# Patient Record
Sex: Male | Born: 1970 | Race: White | Hispanic: No | Marital: Married | Smoking: Former smoker
Health system: Southern US, Community
[De-identification: ages and names within clinical notes are randomized; demographics above are authoritative.]

## PROBLEM LIST (undated history)

## (undated) DIAGNOSIS — R7989 Other specified abnormal findings of blood chemistry: Secondary | ICD-10-CM

## (undated) DIAGNOSIS — F419 Anxiety disorder, unspecified: Secondary | ICD-10-CM

## (undated) DIAGNOSIS — E119 Type 2 diabetes mellitus without complications: Secondary | ICD-10-CM

## (undated) DIAGNOSIS — Z87891 Personal history of nicotine dependence: Secondary | ICD-10-CM

## (undated) DIAGNOSIS — G4730 Sleep apnea, unspecified: Secondary | ICD-10-CM

## (undated) DIAGNOSIS — K219 Gastro-esophageal reflux disease without esophagitis: Secondary | ICD-10-CM

## (undated) DIAGNOSIS — E7800 Pure hypercholesterolemia, unspecified: Secondary | ICD-10-CM

## (undated) DIAGNOSIS — E785 Hyperlipidemia, unspecified: Secondary | ICD-10-CM

## (undated) HISTORY — DX: Sleep apnea, unspecified: G47.30

## (undated) HISTORY — DX: Other specified abnormal findings of blood chemistry: R79.89

## (undated) HISTORY — PX: CARPAL TUNNEL RELEASE: SHX101

## (undated) HISTORY — PX: WISDOM TOOTH EXTRACTION: SHX21

## (undated) HISTORY — DX: Gastro-esophageal reflux disease without esophagitis: K21.9

## (undated) HISTORY — DX: Hyperlipidemia, unspecified: E78.5

## (undated) HISTORY — PX: TENDON REPAIR: SHX5111

## (undated) HISTORY — DX: Personal history of nicotine dependence: Z87.891

---

## 2012-05-31 ENCOUNTER — Ambulatory Visit: Payer: Self-pay

## 2014-12-05 ENCOUNTER — Encounter: Payer: Self-pay | Admitting: Emergency Medicine

## 2014-12-05 ENCOUNTER — Emergency Department
Admission: EM | Admit: 2014-12-05 | Discharge: 2014-12-05 | Disposition: A | Discharge status: Home / Self Care | Payer: 59 | Attending: Emergency Medicine | Admitting: Emergency Medicine

## 2014-12-05 ENCOUNTER — Emergency Department: Payer: 59

## 2014-12-05 DIAGNOSIS — M779 Enthesopathy, unspecified: Secondary | ICD-10-CM | POA: Diagnosis not present

## 2014-12-05 DIAGNOSIS — E119 Type 2 diabetes mellitus without complications: Secondary | ICD-10-CM | POA: Insufficient documentation

## 2014-12-05 DIAGNOSIS — M25512 Pain in left shoulder: Secondary | ICD-10-CM | POA: Diagnosis present

## 2014-12-05 DIAGNOSIS — M7522 Bicipital tendinitis, left shoulder: Secondary | ICD-10-CM

## 2014-12-05 HISTORY — DX: Type 2 diabetes mellitus without complications: E11.9

## 2014-12-05 HISTORY — DX: Pure hypercholesterolemia, unspecified: E78.00

## 2014-12-05 HISTORY — DX: Anxiety disorder, unspecified: F41.9

## 2014-12-05 MED ORDER — TRAMADOL HCL 50 MG PO TABS: 50.0000 mg | ORAL_TABLET | Freq: Two times a day (BID) | ORAL | Status: DC

## 2014-12-05 MED ORDER — KETOROLAC TROMETHAMINE 10 MG PO TABS
20.0000 mg | ORAL_TABLET | Freq: Once | ORAL | Status: AC
  Administered 2014-12-05: 20 mg via ORAL

## 2014-12-05 MED ORDER — KETOROLAC TROMETHAMINE 10 MG PO TABS
ORAL_TABLET | ORAL | Status: AC
  Filled 2014-12-05: qty 2

## 2014-12-05 MED ORDER — OMEPRAZOLE 40 MG PO CPDR
40.0000 mg | DELAYED_RELEASE_CAPSULE | Freq: Every day | ORAL | Status: DC
Start: 2014-12-05 — End: 2017-12-09

## 2014-12-05 MED ORDER — KETOROLAC TROMETHAMINE 10 MG PO TABS: 10.0000 mg | ORAL_TABLET | Freq: Three times a day (TID) | ORAL | Status: DC

## 2014-12-05 NOTE — Discharge Instructions (Signed)
Bicipital Tendonitis Bicipital tendonitis refers to redness, soreness, and swelling (inflammation) or irritation of the bicep tendon. The biceps muscle is located between the elbow and shoulder of the inner arm. The tendon heads, similar to pieces of rope, connect the bicep muscle to the shoulder socket. They are called short head and long head tendons. When tendonitis occurs, the long head tendon is inflamed and swollen, and may be thickened or partially torn.  Bicipital tendonitis can occur with other problems as well, such as arthritis in the shoulder or acromioclavicular joints, tears in the tendons, or other rotator cuff problems.  CAUSES  Overuse of of the arms for overhead activities is the major cause of tendonitis. Many athletes, such as swimmers, baseball players, and tennis players are prone to bicipital tendonitis. Jobs that require manual labor or routine chores, especially chores involving overhead activities can result in overuse and tendonitis. SYMPTOMS Symptoms may include:  Pain in and around the front of the shoulder. Pain may be worse with overhead motion.  Pain or aching that radiates down the arm.  Clicking or shifting sensations in the shoulder. DIAGNOSIS Your caregiver may perform the following:  Physical exam and tests of the biceps and shoulder to observe range of motion, strength, and stability.  X-rays or magnetic resonance imaging (MRI) to confirm the diagnosis. In most common cases, these tests are not necessary. Since other problems may exist in the shoulder or rotator cuff, additional tests may be recommended. TREATMENT Treatment may include the following:  Medications  Your caregiver may prescribe over-the-counter pain relievers.  Steroid injections, such as cortisone, may be recommended. These may help to reduce inflammation and pain.  Physical Therapy - Your caregiver may recommend gentle exercises with the arm. These can help restore strength and range  of motion. They may be done at home or with a physical therapist's supervision and input.  Surgery - Arthroscopic or open surgery sometimes is necessary. Surgery may include:  Reattachment or repair of the tendon at the shoulder socket.  Removal of the damaged section of the tendon.  Anchoring the tendon to a different area of the shoulder (tenodesis). HOME CARE INSTRUCTIONS   Avoid overhead motion of the affected arm or any other motion that causes pain.  Take medication for pain as directed. Do not take these for more than 3 weeks, unless directed to do so by your caregiver.  Ice the affected area for 20 minutes at a time, 3-4 times per day. Place a towel on the skin over the painful area and the ice or cold pack over the towel. Do not place ice directly on the skin.  Perform gentle exercises at home as directed. These will increase strength and flexibility. PREVENTION  Modify your activities as much as possible to protect your arm. A physical therapist or sports medicine physician can help you understand options for safe motion.  Avoid repetitive overhead pulling, lifting, reaching, and throwing until your caregiver tells you it is ok to resume these activities. SEEK MEDICAL CARE IF:  Your pain worsens.  You have difficulty moving the affected arm.  You have trouble performing any of the self-care instructions. MAKE SURE YOU:   Understand these instructions.  Will watch your condition.  Will get help right away if you are not doing well or get worse. Document Released: 07/22/2010 Document Revised: 09/11/2011 Document Reviewed: 07/22/2010 [facility] Patient Information 2015 [place], [place]. This information is not intended to replace advice given to you by your health care provider.  Make sure you discuss any questions you have with your health care provider.  Take the prescription meds as directed.  Apply ice to reduce symptoms.  Follow-up with Dean Farmer for ongoing symptoms.

## 2014-12-05 NOTE — ED Notes (Signed)
Reports left shoulder pain x 3 months, worse with movement.

## 2014-12-05 NOTE — ED Notes (Signed)
Pt unable to sign discharge instructions due to computer down.

## 2014-12-05 NOTE — ED Provider Notes (Signed)
[facility] Emergency Department Provider Note ____________________________________________  Time seen: 1325  I have reviewed the triage vital signs and the nursing notes.  HISTORY  Chief Complaint Shoulder Pain  HPI Dean Farmer is a 44 y.o. male left-hand dominant, with left shoulder pain intermittently over the last 1-2 months. He denies any known injury or trauma, but describes a constant dull pain with occasional sharp pain. He works making foam for bus seats, but denies any lifting injury or increased work load.He describes the shoulder pain is aggravated by ranging the shoulder and external rotation as well as some extension range. He reports his pain at 8 out of 10 at triage.  Past Medical History  Diagnosis Date  . Diabetes mellitus without complication   . Hypercholesteremia   . Anxiety    There are no active problems to display for this patient.  History reviewed. No pertinent past surgical history.  Current Outpatient Rx  Name  Route  Sig  Dispense  Refill  . ketorolac (TORADOL) 10 MG tablet   Oral   Take 1 tablet (10 mg total) by mouth every 8 (eight) hours.   15 tablet   0   . omeprazole (PRILOSEC) 40 MG capsule   Oral   Take 1 capsule (40 mg total) by mouth daily.   20 capsule   0   . traMADol (ULTRAM) 50 MG tablet   Oral   Take 1 tablet (50 mg total) by mouth 2 (two) times daily.   10 tablet   0    Allergies Review of patient's allergies indicates no known allergies.  History reviewed. No pertinent family history.  Social History History  Substance Use Topics  . Smoking status: Never Smoker   . Smokeless tobacco: Not on file  . Alcohol Use: No   Review of Systems  Constitutional: Negative for fever. Eyes: Negative for visual changes. ENT: Negative for sore throat. Cardiovascular: Negative for chest pain. Respiratory: Negative for shortness of breath. Gastrointestinal: Negative for abdominal pain, vomiting and  diarrhea. Genitourinary: Negative for dysuria. Musculoskeletal: Negative for back pain. Positive for shoulder pain as above. Skin: Negative for rash. Neurological: Negative for headaches, focal weakness or numbness. ____________________________________________  PHYSICAL EXAM:  VITAL SIGNS: ED Triage Vitals  Enc Vitals Group     BP 12/05/14 1237 133/69 mmHg     Pulse Rate 12/05/14 1237 81     Resp 12/05/14 1237 20     Temp 12/05/14 1237 98.6 F (37 C)     Temp Source 12/05/14 1237 Oral     SpO2 12/05/14 1237 97 %     Weight 12/05/14 1237 305 lb (138.347 kg)     Height 12/05/14 1237 6' (1.829 m)     Head Cir --      Peak Flow --      Pain Score 12/05/14 1238 8     Pain Loc --      Pain Edu? --      Excl. in [place]? --    Constitutional: Alert and oriented. Well appearing and in no distress. Eyes: Conjunctivae are normal. PERRL. Normal extraocular movements. ENT   Head: Normocephalic and atraumatic.   Nose: No congestion/rhinnorhea.   Mouth/Throat: Mucous membranes are moist.   Neck: No stridor. Hematological/Lymphatic/Immunilogical: No cervical lymphadenopathy. Cardiovascular: Normal rate, regular rhythm.  Respiratory: Normal respiratory effort.No wheezes/rales/rhonchi. Gastrointestinal: Soft and nontender. No distention. Musculoskeletal: Nontender with normal range of motion in LUE. Tenderness to palp over the left biceps tendon.  Normal strength testing. Mildly positive Yergason's on the left.   Neurologic:  Normal speech and language. No gross focal neurologic deficits are appreciated. Skin:  Skin is warm, dry and intact. No rash noted. Psychiatric: Mood and affect are normal. Patient exhibits appropriate insight and judgment. ____________________________________________   RADIOLOGY Left Shoulder IMPRESSION: No acute osseous abnormality. ____________________________________________  PROCEDURES  Toradol 20 mg  PO ____________________________________________  INITIAL IMPRESSION / ASSESSMENT AND PLAN / ED COURSE  Radiology results to patient and family Biceps tendinitis. Treatment with Toradol, Ultram, and Omeprazole for history of reflux. Referral to ortho for ongoing symptoms. ____________________________________________  FINAL CLINICAL IMPRESSION(S) / ED DIAGNOSES  Final diagnoses:  Biceps tendinitis on left     Melvenia Needles, PA-C 12/05/14 1544  Lavonia Drafts, MD 12/05/14 [id number]

## 2015-05-29 ENCOUNTER — Encounter: Payer: Self-pay | Admitting: Emergency Medicine

## 2015-05-29 ENCOUNTER — Emergency Department
Admission: EM | Admit: 2015-05-29 | Discharge: 2015-05-29 | Disposition: A | Discharge status: Home / Self Care | Payer: Managed Care, Other (non HMO) | Attending: Emergency Medicine | Admitting: Emergency Medicine

## 2015-05-29 DIAGNOSIS — Z791 Long term (current) use of non-steroidal anti-inflammatories (NSAID): Secondary | ICD-10-CM | POA: Diagnosis not present

## 2015-05-29 DIAGNOSIS — E785 Hyperlipidemia, unspecified: Secondary | ICD-10-CM | POA: Insufficient documentation

## 2015-05-29 DIAGNOSIS — E119 Type 2 diabetes mellitus without complications: Secondary | ICD-10-CM | POA: Insufficient documentation

## 2015-05-29 DIAGNOSIS — Z79899 Other long term (current) drug therapy: Secondary | ICD-10-CM | POA: Diagnosis not present

## 2015-05-29 DIAGNOSIS — M545 Low back pain, unspecified: Secondary | ICD-10-CM

## 2015-05-29 DIAGNOSIS — M549 Dorsalgia, unspecified: Secondary | ICD-10-CM | POA: Diagnosis present

## 2015-05-29 MED ORDER — KETOROLAC TROMETHAMINE 60 MG/2ML IM SOLN
60.0000 mg | Freq: Once | INTRAMUSCULAR | Status: AC
Start: 2015-05-29 — End: 2015-05-29
  Administered 2015-05-29: 60 mg via INTRAMUSCULAR
  Filled 2015-05-29: qty 2

## 2015-05-29 MED ORDER — HYDROMORPHONE HCL 1 MG/ML IJ SOLN
1.0000 mg | Freq: Once | INTRAMUSCULAR | Status: AC
  Administered 2015-05-29: 1 mg via INTRAMUSCULAR
  Filled 2015-05-29: qty 1

## 2015-05-29 MED ORDER — ORPHENADRINE CITRATE 30 MG/ML IJ SOLN
60.0000 mg | Freq: Two times a day (BID) | INTRAMUSCULAR | Status: DC
  Administered 2015-05-29: 60 mg via INTRAMUSCULAR
  Filled 2015-05-29: qty 2

## 2015-05-29 NOTE — ED Notes (Signed)
[facility] until 1645

## 2015-05-29 NOTE — ED Notes (Signed)
Denies injury.

## 2015-05-29 NOTE — Discharge Instructions (Signed)
Follow-up with scheduled orthopedic department in 3 days. Back Pain, Adult Back pain is very common. The pain often gets better over time. The cause of back pain is usually not dangerous. Most people can learn to manage their back pain on their own.  HOME CARE  Watch your back pain for any changes. The following actions may help to lessen any pain you are feeling:  Stay active. Start with short walks on flat ground if you can. Try to walk farther each day.  Exercise regularly as told by your doctor. Exercise helps your back heal faster. It also helps avoid future injury by keeping your muscles strong and flexible.  Do not sit, drive, or stand in one place for more than 30 minutes.  Do not stay in bed. Resting more than 1-2 days can slow down your recovery.  Be careful when you bend or lift an object. Use good form when lifting:  Bend at your knees.  Keep the object close to your body.  Do not twist.  Sleep on a firm mattress. Lie on your side, and bend your knees. If you lie on your back, put a pillow under your knees.  Take medicines only as told by your doctor.  Put ice on the injured area.  Put ice in a plastic bag.  Place a towel between your skin and the bag.  Leave the ice on for 20 minutes, 2-3 times a day for the first 2-3 days. After that, you can switch between ice and heat packs.  Avoid feeling anxious or stressed. Find good ways to deal with stress, such as exercise.  Maintain a healthy weight. Extra weight puts stress on your back. GET HELP IF:   You have pain that does not go away with rest or medicine.  You have worsening pain that goes down into your legs or buttocks.  You have pain that does not get better in one week.  You have pain at night.  You lose weight.  You have a fever or chills. GET HELP RIGHT AWAY IF:   You cannot control when you poop (bowel movement) or pee (urinate).  Your arms or legs feel weak.  Your arms or legs lose feeling  (numbness).  You feel sick to your stomach (nauseous) or throw up (vomit).  You have belly (abdominal) pain.  You feel like you may pass out (faint).   This information is not intended to replace advice given to you by your health care provider. Make sure you discuss any questions you have with your health care provider.   Document Released: 12/06/2007 Document Revised: 07/10/2014 Document Reviewed: 10/21/2013 Elsevier Interactive Patient Education [employer].

## 2015-05-29 NOTE — ED Provider Notes (Signed)
[facility] Emergency Department Provider Note  ____________________________________________  Time seen: Approximately 3:47 PM  I have reviewed the triage vital signs and the nursing notes.   HISTORY  Chief Complaint Back Pain    HPI Dean Farmer is a 44 y.o. male patient here for back pain score in all 4 month and half. Patient has been followed by the medicine clinic had x-ray was told it was arthritis. Patient pain is unrelieved with multiple. Tramadol and Zanaflex. Patient scheduled to see orthopedics at medicine in 3 days. Patient denies any radicular component to this pain he denies any bladder or bowel dysfunction. Patient is rating his pain as a 10 over 10 described as sharp.   Past Medical History  Diagnosis Date  . Diabetes mellitus without complication ([place])   . Hypercholesteremia   . Anxiety     There are no active problems to display for this patient.   Past Surgical History  Procedure Laterality Date  . Tendon repair    . Carpal tunnel release      Current Outpatient Rx  Name  Route  Sig  Dispense  Refill  . ketorolac (TORADOL) 10 MG tablet   Oral   Take 1 tablet (10 mg total) by mouth every 8 (eight) hours.   15 tablet   0   . omeprazole (PRILOSEC) 40 MG capsule   Oral   Take 1 capsule (40 mg total) by mouth daily.   20 capsule   0   . traMADol (ULTRAM) 50 MG tablet   Oral   Take 1 tablet (50 mg total) by mouth 2 (two) times daily.   10 tablet   0     Allergies Review of patient's allergies indicates no known allergies.  No family history on file.  Social History Social History  Substance Use Topics  . Smoking status: Never Smoker   . Smokeless tobacco: None  . Alcohol Use: No    Review of Systems Constitutional: No fever/chills Eyes: No visual changes. ENT: No sore throat. Cardiovascular: Denies chest pain. Respiratory: Denies shortness of breath. Gastrointestinal: No abdominal pain.  No nausea, no  vomiting.  No diarrhea.  No constipation. Genitourinary: Negative for dysuria. Musculoskeletal: Positive for back pain. Skin: Negative for rash. Neurological: Negative for headaches, focal weakness or numbness. Endocrine:Diabetes and hyperlipidemia 10-point ROS otherwise negative.  ____________________________________________   PHYSICAL EXAM:  VITAL SIGNS: ED Triage Vitals  Enc Vitals Group     BP 05/29/15 1443 166/86 mmHg     Pulse Rate 05/29/15 1443 96     Resp 05/29/15 1443 20     Temp 05/29/15 1443 98.4 F (36.9 C)     Temp Source 05/29/15 1443 Oral     SpO2 05/29/15 1443 100 %     Weight 05/29/15 1443 295 lb (133.811 kg)     Height 05/29/15 1443 6' (1.829 m)     Head Cir --      Peak Flow --      Pain Score 05/29/15 1443 10     Pain Loc --      Pain Edu? --      Excl. in [place]? --     Constitutional: Alert and oriented. Well appearing and in no acute distress. Eyes: Conjunctivae are normal. PERRL. EOMI. Head: Atraumatic. Nose: No congestion/rhinnorhea. Mouth/Throat: Mucous membranes are moist.  Oropharynx non-erythematous. Neck: No stridor.  No cervical spine tenderness to palpation. Hematological/Lymphatic/Immunilogical: No cervical lymphadenopathy. Cardiovascular: Normal rate, regular rhythm. Grossly normal  heart sounds.  Good peripheral circulation. Respiratory: Normal respiratory effort.  No retractions. Lungs CTAB. Gastrointestinal: Soft and nontender. No distention. No abdominal bruits. No CVA tenderness. Musculoskeletal: No spinal deformity. Patient has some moderate guarding palpation L3-L5. Decreased range of motion's all fields. Patient has negative straight leg test. Neurologic:  Normal speech and language. No gross focal neurologic deficits are appreciated. No gait instability. Skin:  Skin is warm, dry and intact. No rash noted. Psychiatric: Mood and affect are normal. Speech and behavior are normal.  ____________________________________________    LABS (all labs ordered are listed, but only abnormal results are displayed)  Labs Reviewed - No data to display ____________________________________________  EKG   ____________________________________________  RADIOLOGY   ____________________________________________   PROCEDURES  Procedure(s) performed: None  Critical Care performed: No  ____________________________________________   INITIAL IMPRESSION / ASSESSMENT AND PLAN / ED COURSE  Pertinent labs & imaging results that were available during my care of the patient were reviewed by me and considered in my medical decision making (see chart for details).  Acute back pain. Advised patient to follow-up with scheduled orthopedic appointment in 3 days. Patient  given given Dilaudid, Norflex, and Toradol IM. Patient advised to continue previous medications. ____________________________________________   FINAL CLINICAL IMPRESSION(S) / ED DIAGNOSES  Final diagnoses:  Back pain at L4-L5 level      Sable Feil, PA-C 05/29/15 1558  Harvest Dark, MD 05/29/15 2252

## 2015-05-29 NOTE — ED Notes (Signed)
Has RX for mobic, tramadol and tizanidine. No relief from any medications. States he is very uncomfortable. Cannot hold position for long without having to reposition. Is seeing an ortho at [facility] on Tuesday.

## 2015-08-09 ENCOUNTER — Other Ambulatory Visit: Payer: Self-pay | Admitting: Orthopedic Surgery

## 2015-08-09 DIAGNOSIS — M5441 Lumbago with sciatica, right side: Secondary | ICD-10-CM

## 2015-09-07 ENCOUNTER — Ambulatory Visit: Payer: Managed Care, Other (non HMO)

## 2016-07-31 ENCOUNTER — Encounter: Payer: Self-pay | Admitting: Emergency Medicine

## 2016-07-31 ENCOUNTER — Emergency Department
Admission: EM | Admit: 2016-07-31 | Discharge: 2016-07-31 | Disposition: A | Discharge status: Home / Self Care | Payer: Commercial Managed Care - PPO | Attending: Emergency Medicine | Admitting: Emergency Medicine

## 2016-07-31 DIAGNOSIS — R05 Cough: Secondary | ICD-10-CM | POA: Diagnosis present

## 2016-07-31 DIAGNOSIS — E119 Type 2 diabetes mellitus without complications: Secondary | ICD-10-CM | POA: Diagnosis not present

## 2016-07-31 DIAGNOSIS — J111 Influenza due to unidentified influenza virus with other respiratory manifestations: Secondary | ICD-10-CM | POA: Diagnosis not present

## 2016-07-31 MED ORDER — OSELTAMIVIR PHOSPHATE 75 MG PO CAPS
75.0000 mg | ORAL_CAPSULE | Freq: Two times a day (BID) | ORAL | 0 refills | Status: AC
Start: 2016-07-31 — End: 2016-08-05

## 2016-07-31 MED ORDER — HYDROCOD POLST-CPM POLST ER 10-8 MG/5ML PO SUER: 5.0000 mL | Freq: Two times a day (BID) | ORAL | 0 refills | Status: AC

## 2016-07-31 NOTE — ED Notes (Signed)
See triage note developed cough and body aches with some fever for the past 2 days   Low grade temp on arrival

## 2016-07-31 NOTE — ED Provider Notes (Signed)
[facility] Emergency Department Provider Note  ____________________________________________  Time seen: Approximately 6:46 PM  I have reviewed the triage vital signs and the nursing notes.   HISTORY  Chief Complaint Cough    HPI Dean Farmer is a 46 y.o. male presenting to the emergency department with headache, congestion, rhinorrhea, nonproductive cough, malaise, diffuse body aches and diarrhea. Patient has been afebrile. However, he has had chills and sweats. Patient has numerous sick contacts at work. Patient denies chest pain, chest tightness, shortness of breath, current abdominal pain and current nausea. Patient has tried TheraFlu but no other alleviating measures. No recent travel.   Past Medical History:  Diagnosis Date  . Anxiety   . Diabetes mellitus without complication ([place])   . Hypercholesteremia     There are no active problems to display for this patient.   Past Surgical History:  Procedure Laterality Date  . CARPAL TUNNEL RELEASE    . TENDON REPAIR      Prior to Admission medications   Medication Sig Start Date End Date Taking? Authorizing Provider  chlorpheniramine-HYDROcodone (TUSSIONEX PENNKINETIC ER) 10-8 MG/5ML SUER Take 5 mLs by mouth 2 (two) times daily. 07/31/16 08/05/16  Lannie Fields, PA-C  ketorolac (TORADOL) 10 MG tablet Take 1 tablet (10 mg total) by mouth every 8 (eight) hours. 12/05/14   Jenise V Bacon Menshew, PA-C  omeprazole (PRILOSEC) 40 MG capsule Take 1 capsule (40 mg total) by mouth daily. 12/05/14   Jenise V Bacon Menshew, PA-C  oseltamivir (TAMIFLU) 75 MG capsule Take 1 capsule (75 mg total) by mouth 2 (two) times daily. 07/31/16 08/05/16  Lannie Fields, PA-C  traMADol (ULTRAM) 50 MG tablet Take 1 tablet (50 mg total) by mouth 2 (two) times daily. 12/05/14   Dannielle Karvonen Menshew, PA-C    Allergies Patient has no known allergies.  No family history on file.  Social History Social History  Substance Use Topics   . Smoking status: Never Smoker  . Smokeless tobacco: Not on file  . Alcohol use No     Review of Systems  Constitutional: Patient has had chills/sweats Eyes: No visual changes. No discharge ENT: Patient has had congestion.  Cardiovascular: no chest pain. Respiratory: Patient has had non-productive cough. No SOB. Gastrointestinal: Patient has had nausea and diarrhea. Genitourinary: Negative for dysuria. No hematuria Musculoskeletal: Patient has had myalgias. Skin: Negative for rash, abrasions, lacerations, ecchymosis. Neurological: Negative for headaches, focal weakness or numbness.  ____________________________________________   PHYSICAL EXAM:  VITAL SIGNS: ED Triage Vitals  Enc Vitals Group     BP 07/31/16 1820 137/79     Pulse Rate 07/31/16 1820 93     Resp 07/31/16 1820 20     Temp 07/31/16 1820 99.4 F (37.4 C)     Temp Source 07/31/16 1820 Oral     SpO2 07/31/16 1820 98 %     Weight 07/31/16 1821 270 lb (122.5 kg)     Height 07/31/16 1821 6' (1.829 m)     Head Circumference --      Peak Flow --      Pain Score 07/31/16 1821 9     Pain Loc --      Pain Edu? --      Excl. in [place]? --      Constitutional: Alert and oriented. Patient is lying supine in bed.  Eyes: Conjunctivae are normal. PERRL. EOMI. Head: Atraumatic. ENT:      Ears: Tympanic membranes are injected bilaterally without  evidence of effusion or purulent exudate. Bony landmarks are visualized bilaterally. No pain with palpation at the tragus.      Nose: Nasal turbinates are edematous and erythematous. Copious rhinorrhea visualized.      Mouth/Throat: Mucous membranes are moist. Posterior pharynx is mildly erythematous. No tonsillar hypertrophy or purulent exudate. Uvula is midline. Neck: Full range of motion. No pain is elicited with flexion at the neck. Hematological/Lymphatic/Immunilogical: No cervical lymphadenopathy. Cardiovascular: Normal rate, regular rhythm. Normal S1 and S2.  Good peripheral  circulation. Respiratory: Normal respiratory effort without tachypnea or retractions. Patient coughs throughout lung exam.  Lungs CTAB. Good air entry to the bases with no decreased or absent breath sounds. Gastrointestinal: Bowel sounds 4 quadrants. Soft and nontender to palpation. No guarding or rigidity. No palpable masses. No distention. No CVA tenderness.  Skin:  Skin is warm, dry and intact. No rash noted. Psychiatric: Mood and affect are normal. Speech and behavior are normal. Patient exhibits appropriate insight and judgement. ____________________________________________   LABS (all labs ordered are listed, but only abnormal results are displayed)  Labs Reviewed - No data to display ____________________________________________  EKG   ____________________________________________  RADIOLOGY  No results found.  ____________________________________________    PROCEDURES  Procedure(s) performed:    Procedures    Medications - No data to display   ____________________________________________   INITIAL IMPRESSION / ASSESSMENT AND PLAN / ED COURSE  Pertinent labs & imaging results that were available during my care of the patient were reviewed by me and considered in my medical decision making (see chart for details).  Review of the [place] CSRS was performed in accordance of the [place] prior to dispensing any controlled drugs.     Assessment and Plan:  Influenza:  Patient presents to the emergency department with headache, congestion, rhinorrhea, nonproductive cough, malaise, diffuse body aches and diarrhea. Symptoms are consistent with influenza. Patient was discharged with Tamiflu and Tussionex for cough. Vital signs and physical exam are reassuring at this time. Patient was advised to follow-up with his primary care provider in one week. All patient questions were answered.  ____________________________________________  FINAL CLINICAL IMPRESSION(S) / ED  DIAGNOSES  Final diagnoses:  Influenza      NEW MEDICATIONS STARTED DURING THIS VISIT:  New Prescriptions   CHLORPHENIRAMINE-HYDROCODONE (TUSSIONEX PENNKINETIC ER) 10-8 MG/5ML SUER    Take 5 mLs by mouth 2 (two) times daily.   OSELTAMIVIR (TAMIFLU) 75 MG CAPSULE    Take 1 capsule (75 mg total) by mouth 2 (two) times daily.        This chart was dictated using voice recognition software/Dragon. Despite best efforts to proofread, errors can occur which can change the meaning. Any change was purely unintentional.    Lannie Fields, PA-C 07/31/16 1859    Harvest Dark, MD 08/03/16 1459

## 2016-07-31 NOTE — ED Triage Notes (Signed)
Cough fever and body aches x 2 days.

## 2017-09-14 ENCOUNTER — Emergency Department: Payer: Commercial Managed Care - PPO

## 2017-09-14 ENCOUNTER — Emergency Department
Admission: EM | Admit: 2017-09-14 | Discharge: 2017-09-14 | Disposition: A | Discharge status: Home / Self Care | Payer: Commercial Managed Care - PPO | Attending: Emergency Medicine | Admitting: Emergency Medicine

## 2017-09-14 ENCOUNTER — Encounter: Payer: Self-pay | Admitting: Emergency Medicine

## 2017-09-14 ENCOUNTER — Other Ambulatory Visit: Payer: Self-pay

## 2017-09-14 DIAGNOSIS — E119 Type 2 diabetes mellitus without complications: Secondary | ICD-10-CM | POA: Diagnosis not present

## 2017-09-14 DIAGNOSIS — Y9389 Activity, other specified: Secondary | ICD-10-CM | POA: Insufficient documentation

## 2017-09-14 DIAGNOSIS — Y998 Other external cause status: Secondary | ICD-10-CM | POA: Diagnosis not present

## 2017-09-14 DIAGNOSIS — Z79899 Other long term (current) drug therapy: Secondary | ICD-10-CM | POA: Diagnosis not present

## 2017-09-14 DIAGNOSIS — W230XXA Caught, crushed, jammed, or pinched between moving objects, initial encounter: Secondary | ICD-10-CM | POA: Insufficient documentation

## 2017-09-14 DIAGNOSIS — Y929 Unspecified place or not applicable: Secondary | ICD-10-CM | POA: Diagnosis not present

## 2017-09-14 DIAGNOSIS — Z7984 Long term (current) use of oral hypoglycemic drugs: Secondary | ICD-10-CM | POA: Diagnosis not present

## 2017-09-14 DIAGNOSIS — S8010XA Contusion of unspecified lower leg, initial encounter: Secondary | ICD-10-CM | POA: Diagnosis not present

## 2017-09-14 DIAGNOSIS — M545 Low back pain: Secondary | ICD-10-CM | POA: Insufficient documentation

## 2017-09-14 DIAGNOSIS — S8780XA Crushing injury of unspecified lower leg, initial encounter: Secondary | ICD-10-CM | POA: Diagnosis present

## 2017-09-14 MED ORDER — OXYCODONE-ACETAMINOPHEN 5-325 MG PO TABS: 1.0000 | ORAL_TABLET | Freq: Once | ORAL | Status: DC

## 2017-09-14 MED ORDER — OXYCODONE-ACETAMINOPHEN 5-325 MG PO TABS
1.0000 | ORAL_TABLET | Freq: Once | ORAL | Status: AC
  Administered 2017-09-14: 1 via ORAL
  Filled 2017-09-14: qty 1
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
## 2017-12-09 ENCOUNTER — Emergency Department (HOSPITAL_COMMUNITY): Payer: Commercial Managed Care - PPO

## 2017-12-09 ENCOUNTER — Emergency Department (HOSPITAL_COMMUNITY)
Admission: EM | Admit: 2017-12-09 | Discharge: 2017-12-09 | Disposition: A | Discharge status: Home / Self Care | Payer: Commercial Managed Care - PPO | Attending: Emergency Medicine | Admitting: Emergency Medicine

## 2017-12-09 DIAGNOSIS — R4182 Altered mental status, unspecified: Secondary | ICD-10-CM | POA: Diagnosis present

## 2017-12-09 DIAGNOSIS — T40601A Poisoning by unspecified narcotics, accidental (unintentional), initial encounter: Secondary | ICD-10-CM | POA: Insufficient documentation

## 2017-12-09 DIAGNOSIS — F10920 Alcohol use, unspecified with intoxication, uncomplicated: Secondary | ICD-10-CM | POA: Insufficient documentation

## 2017-12-09 DIAGNOSIS — Y902 Blood alcohol level of 40-59 mg/100 ml: Secondary | ICD-10-CM | POA: Diagnosis not present

## 2017-12-09 LAB — COMPREHENSIVE METABOLIC PANEL
ALT: 21 U/L (ref 17–63)
AST: 35 U/L (ref 15–41)
Albumin: 4.6 g/dL (ref 3.5–5.0)
Alkaline Phosphatase: 96 U/L (ref 38–126)
Anion gap: 14 (ref 5–15)
BUN: 12 mg/dL (ref 6–20)
CO2: 23 mmol/L (ref 22–32)
Calcium: 9.1 mg/dL (ref 8.9–10.3)
Chloride: 99 mmol/L — ABNORMAL LOW (ref 101–111)
Creatinine, Ser: 0.86 mg/dL (ref 0.61–1.24)
GFR calc Af Amer: >60 mL/min (ref >60)
GFR calc non Af Amer: >60 mL/min (ref >60)
Glucose, Bld: 369 mg/dL — ABNORMAL HIGH (ref 65–99)
Potassium: 3.7 mmol/L (ref 3.5–5.1)
Sodium: 136 mmol/L (ref 135–145)
Total Bilirubin: 0.6 mg/dL (ref 0.3–1.2)
Total Protein: 8 g/dL (ref 6.5–8.1)

## 2017-12-09 LAB — RAPID URINE DRUG SCREEN, HOSP PERFORMED
Amphetamines: NOT DETECTED
Barbiturates: NOT DETECTED
Benzodiazepines: NOT DETECTED
Cocaine: NOT DETECTED
Opiates: POSITIVE — AB
Tetrahydrocannabinol: POSITIVE — AB

## 2017-12-09 LAB — CBC WITH DIFFERENTIAL/PLATELET
Basophils Absolute: 0 10*3/uL (ref 0.0–0.1)
Basophils Relative: 0 %
Eosinophils Absolute: 0 10*3/uL (ref 0.0–0.7)
Eosinophils Relative: 0 %
HCT: 45.8 % (ref 39.0–52.0)
Hemoglobin: 16.4 g/dL (ref 13.0–17.0)
Lymphocytes Relative: 10 %
Lymphs Abs: 1 10*3/uL (ref 0.7–4.0)
MCH: 30.9 pg (ref 26.0–34.0)
MCHC: 35.8 g/dL (ref 30.0–36.0)
MCV: 86.3 fL (ref 78.0–100.0)
Monocytes Absolute: 0.6 10*3/uL (ref 0.1–1.0)
Monocytes Relative: 6 %
Neutro Abs: 7.9 10*3/uL — ABNORMAL HIGH (ref 1.7–7.7)
Neutrophils Relative %: 84 %
Platelets: 162 10*3/uL (ref 150–400)
RBC: 5.31 MIL/uL (ref 4.22–5.81)
RDW: 14.2 % (ref 11.5–15.5)
WBC: 9.5 10*3/uL (ref 4.0–10.5)

## 2017-12-09 LAB — URINALYSIS, ROUTINE W REFLEX MICROSCOPIC
Bilirubin Urine: NEGATIVE
Glucose, UA: >=500 mg/dL — AB
Hgb urine dipstick: NEGATIVE
Ketones, ur: NEGATIVE mg/dL
Leukocytes, UA: NEGATIVE
Nitrite: NEGATIVE
Protein, ur: NEGATIVE mg/dL
Specific Gravity, Urine: 1.002 — ABNORMAL LOW (ref 1.005–1.030)
pH: 6 (ref 5.0–8.0)

## 2017-12-09 LAB — I-STAT TROPONIN, ED: Troponin i, poc: 0 ng/mL (ref 0.00–0.08)

## 2017-12-09 LAB — ETHANOL: Alcohol, Ethyl (B): 56 mg/dL — ABNORMAL HIGH (ref <10)

## 2017-12-09 LAB — LIPASE, BLOOD: Lipase: 24 U/L (ref 11–51)

## 2017-12-09 LAB — MAGNESIUM: Magnesium: 1.9 mg/dL (ref 1.7–2.4)

## 2017-12-09 MED ORDER — SODIUM CHLORIDE 0.9 % IV BOLUS
1000.0000 mL | Freq: Once | INTRAVENOUS | Status: AC
  Administered 2017-12-09: 1000 mL via INTRAVENOUS
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
## 2017-12-09 NOTE — ED Notes (Signed)
Bed: AX09 Expected date:  Expected time:  Means of arrival:  Comments: 47 yo AMS- narcan given

## 2017-12-09 NOTE — Discharge Instructions (Signed)
Your work-up today was reassuring.  We suspect you had an axonal narcotic overdose of some kind.  He responded well to the Narcan and have been well since you have been here.  Your lab work was reassuring.  Please follow-up with a primary care physician for further management as well as follow-up with a mental health team for the problem we discussed.  If any symptoms change or worsen, please return to the nearest emergency department.

## 2017-12-09 NOTE — ED Notes (Signed)
EKG given to EDP,Tegeler,MD., for review. 

## 2017-12-09 NOTE — ED Triage Notes (Signed)
Pt comes to ed via ems, etoh overdose became unresponsive, pt was narcan ed  2mg  internal. Hx of diabetes.  bp 134/92, spo2 94 percent, pulse 92, [id number].

## 2020-02-08 IMAGING — CR DG CHEST 2V
2 series · 2 of 2 positions shown · non-contrast
Comparison: May 17, 2009

CLINICAL DATA: Patient is unresponsive, alcohol intoxication

EXAM:
CHEST - 2 VIEW

[w chest pa]
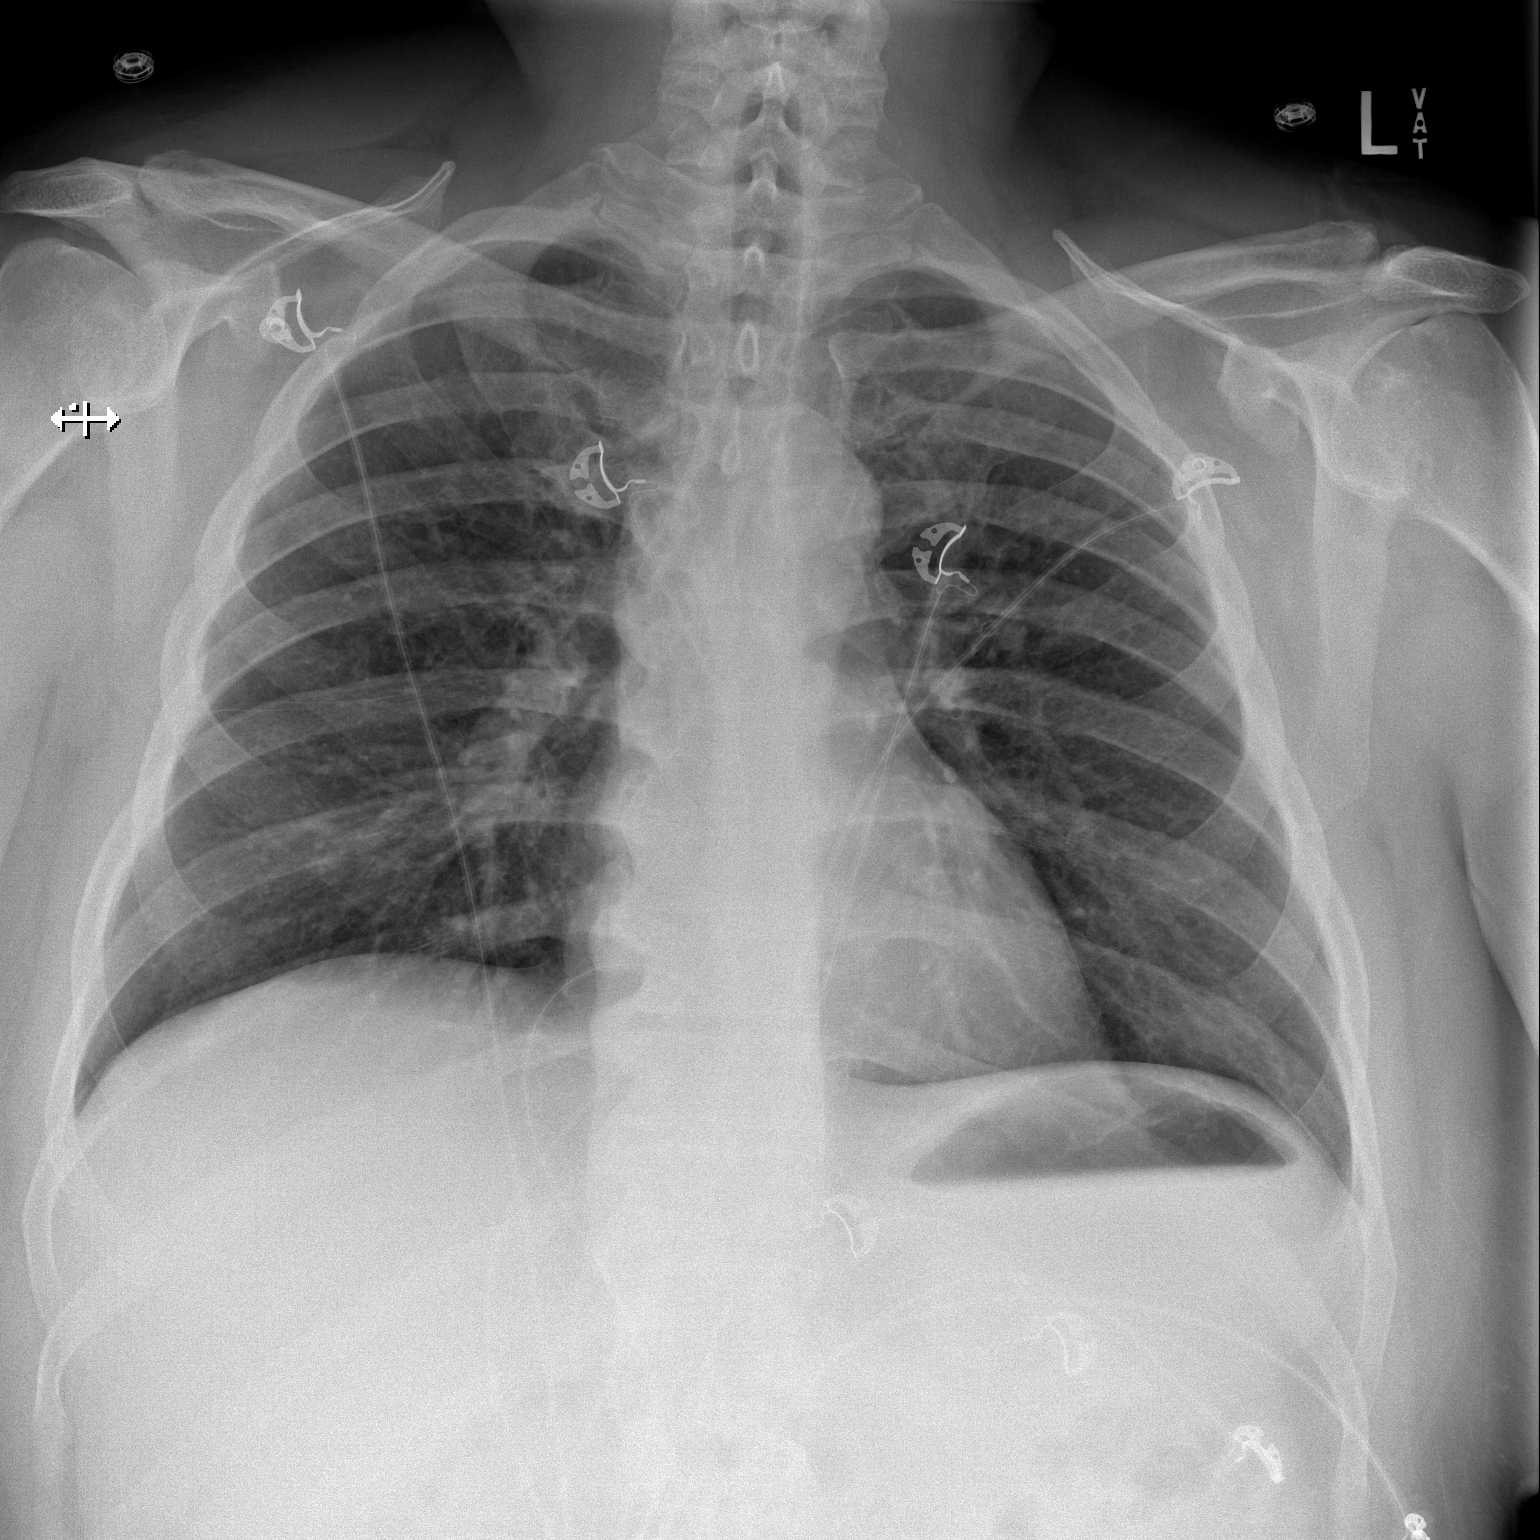

[w chest lat]
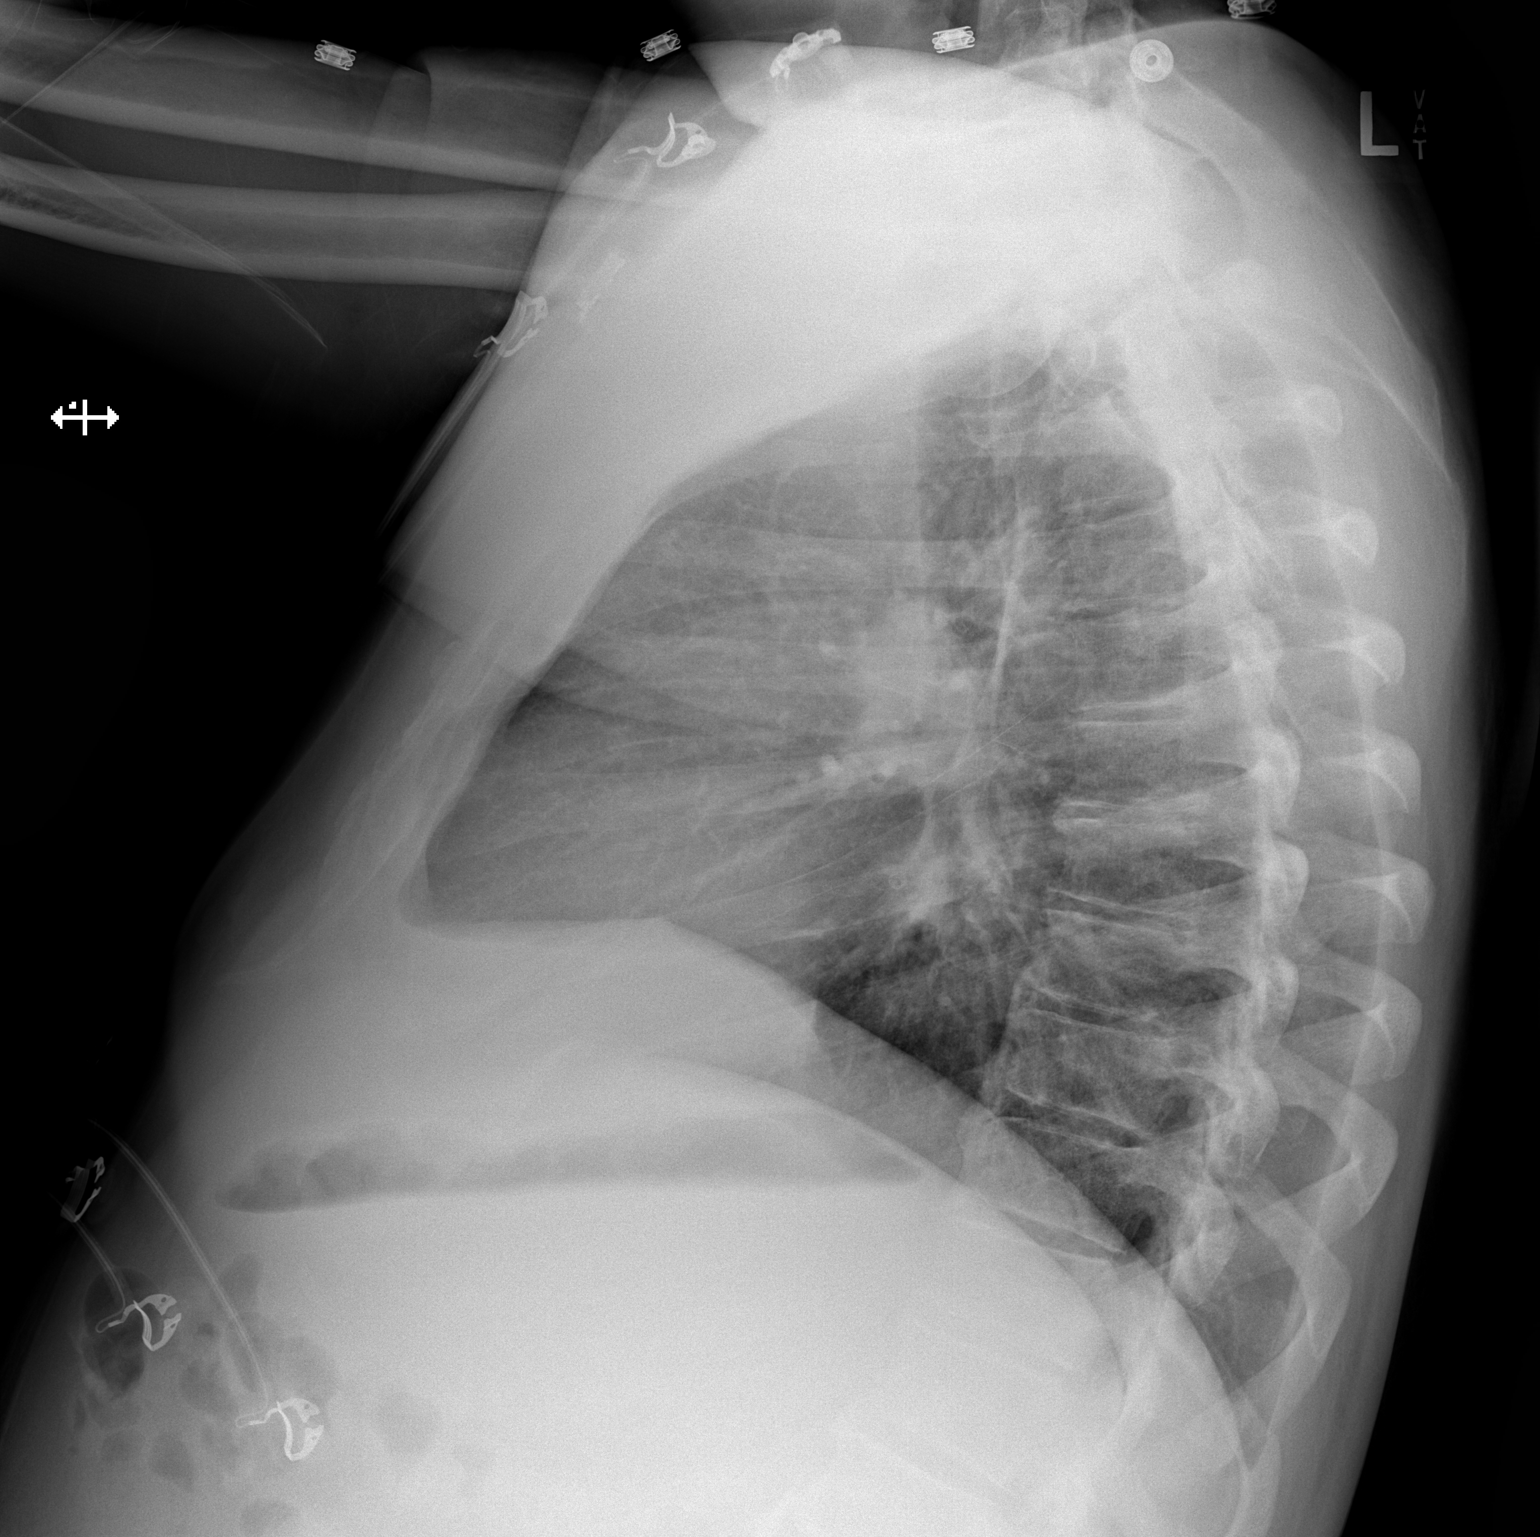

[2 of 2 positions shown; findings below may reference images not displayed]

FINDINGS: The heart size and mediastinal contours are within normal limits.
Both lungs are clear. The visualized skeletal structures are
unremarkable.
IMPRESSION: No active cardiopulmonary disease.

## 2020-09-22 ENCOUNTER — Encounter: Payer: Self-pay | Admitting: Gastroenterology

## 2020-09-22 ENCOUNTER — Ambulatory Visit (INDEPENDENT_AMBULATORY_CARE_PROVIDER_SITE_OTHER): Payer: Commercial Managed Care - PPO | Admitting: Gastroenterology

## 2020-09-22 ENCOUNTER — Other Ambulatory Visit: Payer: Self-pay

## 2020-09-22 DIAGNOSIS — R1310 Dysphagia, unspecified: Secondary | ICD-10-CM

## 2020-09-22 DIAGNOSIS — R112 Nausea with vomiting, unspecified: Secondary | ICD-10-CM | POA: Diagnosis not present

## 2020-09-22 DIAGNOSIS — Z1211 Encounter for screening for malignant neoplasm of colon: Secondary | ICD-10-CM | POA: Diagnosis not present

## 2020-09-22 MED ORDER — SUPREP BOWEL PREP KIT 17.5-3.13-1.6 GM/177ML PO SOLN: 1.0000 | ORAL | 0 refills | Status: DC

## 2020-09-22 MED ORDER — OMEPRAZOLE 40 MG PO CPDR: 40.0000 mg | DELAYED_RELEASE_CAPSULE | Freq: Every day | ORAL | 6 refills | Status: DC

## 2020-09-22 NOTE — Progress Notes (Signed)
HPI: This is a very pleasant 50 year old man who was referred to me by London Pepper, MD  to evaluate intermittent solid and liquid food dysphagia.    For a year or so he was bothered by nausea and vomiting on nearly a daily basis.  When he stopped Ozempic 3 weeks ago this is significantly improved.  He has not had vomiting since then.  He does still have mild nausea.  He has pyrosis, heartburn only rarely.  He has solid and liquid food dysphagia on an intermittent basis several times a week.  This can cause spasms in his chest.  He has never had to regurgitate material out.  One of his uncles had either gastric or esophageal cancer.  Another uncle had colon cancer.  His weight is overall stable.   Review of systems: Pertinent positive and negative review of systems were noted in the above HPI section. All other review negative.   Past Medical History:  Diagnosis Date  . Anxiety   . Diabetes mellitus without complication ([place])   . Hypercholesteremia   . Hyperlipidemia   . Low vitamin D level     Past Surgical History:  Procedure Laterality Date  . CARPAL TUNNEL RELEASE    . TENDON REPAIR      Current Outpatient Medications  Medication Sig Dispense Refill  . DULoxetine (CYMBALTA) 30 MG capsule Take 1 capsule by mouth in the morning and at bedtime.    . empagliflozin (JARDIANCE) 25 MG TABS tablet Take 25 mg by mouth daily.    . pioglitazone (ACTOS) 45 MG tablet Take 45 mg by mouth daily.    . sildenafil (VIAGRA) 50 MG tablet Take 50 mg by mouth daily as needed for erectile dysfunction.     No current facility-administered medications for this visit.    Allergies as of 09/22/2020 - Review Complete 09/22/2020  Allergen Reaction Noted  . Ozempic (0.25 or 0.5 mg-dose) [semaglutide(0.25 or 0.5mg -dos)] Nausea And Vomiting 09/20/2020    History reviewed. No pertinent family history.  Social History   Socioeconomic History  . Marital status: Married    Spouse name: Not on  file  . Number of children: Not on file  . Years of education: Not on file  . Highest education level: Not on file  Occupational History  . Not on file  Tobacco Use  . Smoking status: Current Every Day Smoker    Types: Cigarettes  . Smokeless tobacco: Never Used  Substance and Sexual Activity  . Alcohol use: Yes    Comment: rare  . Drug use: No  . Sexual activity: Not on file  Other Topics Concern  . Not on file  Social History Narrative  . Not on file   Social Determinants of Health   Financial Resource Strain: Not on file  Food Insecurity: Not on file  Transportation Needs: Not on file  Physical Activity: Not on file  Stress: Not on file  Social Connections: Not on file  Intimate Partner Violence: Not on file     Physical Exam: Ht 5\' 11"  (1.803 m) Comment: height measured without shoes  Wt 263 lb (119.3 kg)   BMI 36.68 kg/m  Constitutional: generally well-appearing Psychiatric: alert and oriented x3 Eyes: extraocular movements intact Mouth: oral pharynx moist, no lesions Neck: supple no lymphadenopathy Cardiovascular: heart regular rate and rhythm Lungs: clear to auscultation bilaterally Abdomen: soft, nontender, nondistended, no obvious ascites, no peritoneal signs, normal bowel sounds Extremities: no lower extremity edema bilaterally Skin: no lesions on  visible extremities   Assessment and plan: 50 y.o. male with mild chronic constipation, routine risk for colon cancer, chronic nausea, intermittent vomiting, dysphagia to solids and liquids  First for his mild chronic constipation I recommended he try fiber supplement on a daily basis with Citrucel orange flavored powder.  He has never had colon cancer screening and I recommended colonoscopy at his soonest convenience.  Second I explained to him that at least some of his symptoms must of been from the [place] because he has felt quite a bit better since stopping the Ozempic.  His nausea and vomiting are much  improved.  He is still bothered by dysphagia to solids and liquids.  Fortunately no weight loss to suggest underlying malignancy.  I explained to him that acid refluxing into his esophagus can certainly cause his symptoms like these.  I recommend a trial of omeprazole and I am prescribing him 40 mg pill 1 pill once daily.  At the same time as his colonoscopy we will proceed with upper endoscopy for better evaluation of his upper GI tract, checking for esophagitis, reflux damage, stricturing, gastritis, H. pylori.  I see no reason for any blood tests or imaging studies prior to then.  Please see the "Patient Instructions" section for addition details about the plan.   Owens Loffler, MD [facility] 09/22/2020, 2:54 PM  Cc: London Pepper, MD  Total time on date of encounter was 60 minutes (this included time spent preparing to see the patient reviewing records; obtaining and/or reviewing separately obtained history; performing a medically appropriate exam and/or evaluation; counseling and educating the patient and family if present; ordering medications, tests or procedures if applicable; and documenting clinical information in the health record).

## 2020-09-22 NOTE — Patient Instructions (Addendum)
If you are age 52 or younger, your body mass index should be between 19-25. Your Body mass index is 36.68 kg/m. If this is out of the aformentioned range listed, please consider follow up with your Primary Care Provider.   You have been scheduled for an endoscopy and colonoscopy. Please follow the written instructions given to you at your visit today. Please pick up your prep supplies at the pharmacy within the next 1-3 days. If you use inhalers (even only as needed), please bring them with you on the day of your procedure.  Due to recent changes in healthcare laws, you may see the results of your imaging and laboratory studies on MyChart before your provider has had a chance to review them.  We understand that in some cases there may be results that are confusing or concerning to you. Not all laboratory results come back in the same time frame and the provider may be waiting for multiple results in order to interpret others.  Please give [place] 48 hours in order for your provider to thoroughly review all the results before contacting the office for clarification of your results.   We have sent the following medications to your pharmacy for you to pick up at your convenience:  START: omeprazole 40mg  take one capsule shortly before breakfast meal each day.  Please start taking citrucel (orange flavored) powder fiber supplement.  This may cause some bloating at first but that usually goes away. Begin with a small spoonful and work your way up to a large, heaping spoonful daily over a week.  Thank you for entrusting me with your care and choosing [facility].  Dr [name]

## 2020-12-03 ENCOUNTER — Telehealth: Payer: Self-pay | Admitting: Gastroenterology

## 2020-12-03 NOTE — Telephone Encounter (Signed)
Good morning Dr. [name], patient called stating he no longer has insurance so he canceled his procedure scheduled for 12/08/20.  He will call back at a later date to reschedule.

## 2020-12-08 ENCOUNTER — Encounter: Payer: Commercial Managed Care - PPO | Admitting: Gastroenterology

## 2021-05-10 ENCOUNTER — Other Ambulatory Visit: Payer: Self-pay | Admitting: Family Medicine

## 2021-05-10 ENCOUNTER — Ambulatory Visit
Admission: RE | Admit: 2021-05-10 | Discharge: 2021-05-10 | Disposition: A | Discharge status: Home / Self Care | Payer: No Typology Code available for payment source | Source: Ambulatory Visit | Attending: Family Medicine | Admitting: Family Medicine

## 2021-05-10 ENCOUNTER — Other Ambulatory Visit: Payer: Self-pay

## 2021-05-10 DIAGNOSIS — M79641 Pain in right hand: Secondary | ICD-10-CM

## 2021-05-19 ENCOUNTER — Ambulatory Visit: Payer: No Typology Code available for payment source | Admitting: Podiatry

## 2021-05-19 ENCOUNTER — Ambulatory Visit (INDEPENDENT_AMBULATORY_CARE_PROVIDER_SITE_OTHER): Payer: No Typology Code available for payment source

## 2021-05-19 ENCOUNTER — Other Ambulatory Visit: Payer: Self-pay

## 2021-05-19 ENCOUNTER — Encounter: Payer: Self-pay | Admitting: Podiatry

## 2021-05-19 DIAGNOSIS — M7751 Other enthesopathy of right foot: Secondary | ICD-10-CM

## 2021-05-19 DIAGNOSIS — L84 Corns and callosities: Secondary | ICD-10-CM

## 2021-05-19 DIAGNOSIS — M79671 Pain in right foot: Secondary | ICD-10-CM

## 2021-05-19 MED ORDER — TRIAMCINOLONE ACETONIDE 10 MG/ML IJ SUSP
10.0000 mg | Freq: Once | INTRAMUSCULAR | Status: AC
  Administered 2021-05-19: 12:00:00 10 mg

## 2021-05-19 NOTE — Progress Notes (Signed)
Subjective:   Patient ID: Dean Farmer, male   DOB: 50 y.o.   MRN: [id number]   HPI Patient presents with a lot of pain in the outside and bottom of his right foot in between his fourth and fifth toes.  States it is gradually become more of an issue for him and he is tried wider shoes he is tried Psychologist, counselling modifications without relief and he does smoke periodic and is not significantly active with moderate health history   Review of Systems  All other systems reviewed and are negative.      Objective:  Physical Exam Vitals and nursing note reviewed.  Constitutional:      Appearance: He is well-developed.  Pulmonary:     Effort: Pulmonary effort is normal.  Musculoskeletal:        General: Normal range of motion.  Skin:    General: Skin is warm.  Neurological:     Mental Status: He is alert.    Neurovascular status intact muscle strength found to be adequate range of motion adequate with patient noted to have exquisite discomfort in the fourth MPJ right and also has inflammation of the inner phalangeal joint lateral side fourth digit right with fluid buildup.  Patient is found to have good digital perfusion well oriented x3     Assessment:  Inflammatory capsulitis fourth MPJ right along with digital deformity with fluid buildup fourth digit right foot lateral side with pressure against the fifth digit     Plan:  H&P condition x-rays reviewed and today I went ahead and I did do sterile forefoot block right 100 mg like Marcaine mixture I aspirated the fourth MPJ getting out a small amount of clear fluid injected quarter cc dexamethasone Kenalog and injected 1/8 of a cc dexamethasone Kenalog fourth interspace right foot to reduce the inflammation and applied padding to the fourth toe.  Discussed possible surgery in future if symptoms persist or get a problem  X-rays did indicate an abutment of the fourth and fifth toes but did not indicate any significant other pathology

## 2021-05-23 ENCOUNTER — Encounter: Payer: Self-pay | Admitting: *Deleted

## 2021-05-23 ENCOUNTER — Ambulatory Visit: Payer: No Typology Code available for payment source | Admitting: Psychiatry

## 2021-05-23 ENCOUNTER — Other Ambulatory Visit: Payer: Self-pay

## 2021-05-23 VITALS — BP 122/64 | HR 88 | Ht 72.0 in | Wt 279.0 lb

## 2021-05-23 DIAGNOSIS — R519 Headache, unspecified: Secondary | ICD-10-CM | POA: Diagnosis not present

## 2021-05-23 DIAGNOSIS — U099 Post covid-19 condition, unspecified: Secondary | ICD-10-CM

## 2021-05-23 DIAGNOSIS — M542 Cervicalgia: Secondary | ICD-10-CM

## 2021-05-23 DIAGNOSIS — G4453 Primary thunderclap headache: Secondary | ICD-10-CM

## 2021-05-23 DIAGNOSIS — G4733 Obstructive sleep apnea (adult) (pediatric): Secondary | ICD-10-CM

## 2021-05-23 DIAGNOSIS — G8929 Other chronic pain: Secondary | ICD-10-CM

## 2021-05-23 MED ORDER — TOPIRAMATE 25 MG PO TABS: ORAL_TABLET | ORAL | 3 refills | Status: DC

## 2021-05-23 NOTE — Patient Instructions (Addendum)
MRI, MRA brain Physical therapy for the neck Start Topamax for headache prevention. Take 25 mg (1 pill) at bedtime for one week, then increase to 50 mg (2 pills) at bedtime for one week, then take 75 mg (3 pills) at bedtime for one week, then take 100 mg (4 pills) at bedtime Referral to Sleep for sleep apnea

## 2021-05-23 NOTE — Progress Notes (Signed)
Referring:  London Pepper, MD [address],  [place] [address]  PCP: London Pepper, MD  Neurology was asked to evaluate Dean Farmer, a 50 year old male for a chief complaint of headaches.  Our recommendations of care will be communicated by shared medical record.    CC:  headaches  HPI:  Medical co-morbidities: DM, HLD, OSA (not on CPAP)  The patient developed COVID at the end of July. Has had constant headaches ever since his COVID infection. Headache is constant 4/5 throbbing occipital pain which radiates forward. Gets up to 10/10 in severity at least once per week. It is associated with photophobia, phonophobia, nausea, and vomiting. Takes Tylenol or Ibuprofen which helps reduce the pain.  Had "stress" headaches prior to [place]. He also has had severe headaches associated with sexual activity for the past couple of years. Headaches will slowly build, then he will develop excruciating pain and feel like he is going to pass out. These have gotten more severe recently.  Has a history of sleep apnea. Used to use a CPAP but currently isn't using one. He is looking to get established with a new Sleep doctor.   Headache History: Onset: July 2022 Triggers: none Most common time of day for headache to begin: worse in the morning, sometimes wakes up in the middle of the night Aura: none Location: bioccipital pain radiating forward Quality/Description: throbbing Severity: 4-10/10 Associated Symptoms:  Photophobia: yes  Phonophobia: yes  Nausea: yes Vomiting: yes Worse with activity?: yes Duration of headaches: constant  Headache days per month: 30 Headache free days per month: 0  Current Treatment: Abortive Tylenol Ibuprofen  Preventative none  Prior Therapies                                 Lexapro 10 mg Cymbalta 30 BID  Headache Risk Factors: Headache risk factors and/or co-morbidities (+) Neck Pain (+) Sleep Disorder - insomnia, sleep  apnea (+) Obesity  Body mass index is 37.84 kg/m. (+) History of Traumatic Brain Injury and/or Concussion - used to play football  LABS: TSH, CMP wnl other than glucose 187  IMAGING:  none  Current Outpatient Medications on File Prior to Visit  Medication Sig Dispense Refill   empagliflozin (JARDIANCE) 25 MG TABS tablet Take 25 mg by mouth daily.     escitalopram (LEXAPRO) 10 MG tablet 1 tablet     pioglitazone (ACTOS) 45 MG tablet Take 45 mg by mouth daily.     sildenafil (VIAGRA) 50 MG tablet Take 50 mg by mouth daily as needed for erectile dysfunction.     TRESIBA FLEXTOUCH 200 UNIT/ML FlexTouch Pen SMARTSIG:15 Unit(s) SUB-Q Daily     Na Sulfate-K Sulfate-Mg Sulf (SUPREP BOWEL PREP KIT) 17.5-3.13-1.6 GM/177ML SOLN Take 1 kit by mouth as directed. (Patient not taking: Reported on 05/23/2021) 324 mL 0   omeprazole (PRILOSEC) 40 MG capsule Take 1 capsule (40 mg total) by mouth daily. 30 capsule 6   No current facility-administered medications on file prior to visit.     Allergies: Allergies  Allergen Reactions   Ozempic (0.25 Or 0.5 Mg-Dose) [Semaglutide(0.25 Or 0.32m-Dos)] Nausea And Vomiting    Family History: Migraine or other headaches in the family:  no Aneurysms in a first degree relative:  no Brain tumors in the family:  no Other neurological illness in the family:   no  Past Medical History: Past Medical History:  Diagnosis Date   Anxiety    Diabetes mellitus without complication ([place])    Hypercholesteremia    Hyperlipidemia    Low vitamin D level     Past Surgical History Past Surgical History:  Procedure Laterality Date   CARPAL TUNNEL RELEASE     TENDON REPAIR      Social History: Social History   Tobacco Use   Smoking status: Every Day    Packs/day: 1.00    Types: Cigarettes   Smokeless tobacco: Former    Types: Nurse, children's Use: Never used  Substance Use Topics   Alcohol use: Not Currently   Drug use: No    ROS: Negative  for fevers, chills. Positive for headaches. All other systems reviewed and negative unless stated otherwise in HPI.   Physical Exam:   Vital Signs: BP 122/64   Pulse 88   Ht 6' (1.829 m)   Wt 279 lb (126.6 kg)   SpO2 95%   BMI 37.84 kg/m  GENERAL: well appearing,in no acute distress,alert SKIN:  Color, texture, turgor normal. No rashes or lesions HEAD:  Normocephalic/atraumatic. CV:  RRR RESP: Normal respiratory effort MSK: +tenderness to palpation over bilateral temples, bilateral occiput  NEUROLOGICAL: Mental Status: Alert, oriented to person, place and time,Follows commands Cranial Nerves: PERRL,visual fields intact to confrontation,extraocular movements intact,facial sensation intact,no facial droop or ptosis,hearing intact to finger rub bilaterally,no dysarthria,palate elevate symmetrically,tongue protrudes midline,shoulder shrug intact and symmetric Motor: muscle strength 5/5 both upper and lower extremities,no drift, normal tone Reflexes: 2+ throughout Sensation: intact to light touch all 4 extremities Coordination: Finger-to- nose-finger intact bilaterally Gait: normal-based   IMPRESSION: 50 year old male with a history of OSA, HLD, DM who presents for evaluation of constant daily headaches following COVID infection. He also has thunderclap headaches associated with sexual activity. Will order MRI/MRA brain to rule out underlying structural cause of his headaches including cerebral aneurysm. Daily headaches are consistent with chronic migraines. Discussed preventive options including propranolol and Topamax, he would prefer to try Topamax due to the weight loss side effect. Will defer rescue medication until MRA is complete. If no aneurysm would consider starting triptan for rescue. Referral to neck PT placed to help with his neck tension and pain. Referral to Sleep placed for OSA managemant as untreated sleep apnea can contribute to chronic headaches.  PLAN: -MRI, MRA  brain -Preventive: Start Topamax 25 mg QHS, uptitrate by 25 mg weekly up to 100 mg QHS -Referral to Neck PT -Referral to Sleep for OSA management -next steps: consider triptan/gepant or indomethacin for rescue, consider propranolol for prevention    I spent a total of 43 minutes chart reviewing and counseling the patient. Headache education was done. Discussed treatment options including preventive and acute medications, natural supplements, and physical therapy. Discussed medication side effects, adverse reactions and drug interactions. Written educational materials and patient instructions outlining all of the above were given.  Follow-up: 3 months   Genia Harold, MD 05/23/2021   3:16 PM

## 2021-06-20 ENCOUNTER — Other Ambulatory Visit: Payer: Self-pay

## 2021-06-20 ENCOUNTER — Ambulatory Visit
Admission: RE | Admit: 2021-06-20 | Discharge: 2021-06-20 | Disposition: A | Discharge status: Home / Self Care | Payer: No Typology Code available for payment source | Source: Ambulatory Visit | Attending: Psychiatry | Admitting: Psychiatry

## 2021-06-20 DIAGNOSIS — G4453 Primary thunderclap headache: Secondary | ICD-10-CM

## 2021-06-20 MED ORDER — GADOBENATE DIMEGLUMINE 529 MG/ML IV SOLN
20.0000 mL | Freq: Once | INTRAVENOUS | Status: AC | PRN
  Administered 2021-06-20: 14:00:00 20 mL via INTRAVENOUS

## 2021-06-23 ENCOUNTER — Telehealth: Payer: Self-pay

## 2021-06-23 NOTE — Telephone Encounter (Signed)
Error

## 2021-06-23 NOTE — Telephone Encounter (Signed)
Contacted pt regarding results, LVM rq call back

## 2021-06-23 NOTE — Telephone Encounter (Signed)
-----   Message from Genia Harold, MD sent at 06/23/2021  8:57 AM EST ----- MRI and MRA are normal

## 2021-06-23 NOTE — Telephone Encounter (Signed)
Contacted pt again, no answer  

## 2021-06-23 NOTE — Telephone Encounter (Signed)
Contacted pt again & spouse (per DPR), no answer

## 2021-06-28 NOTE — Telephone Encounter (Signed)
Contacted pt again, LVM informing pt results will be sent via mail. Advised to call back with questions.

## 2021-07-07 NOTE — Telephone Encounter (Signed)
It can take up to 8 weeks at the full dose of Topamax for headaches to improve. I suspect his sleep apnea is also contributing to his headaches so hopefully his Sleep evaluation will also be helpful. If he still doesn't have improvement at that time we can discuss alternate medication options.

## 2021-07-07 NOTE — Telephone Encounter (Signed)
Pt would like a call back to discuss next step because still having headaches.

## 2021-07-07 NOTE — Telephone Encounter (Signed)
Contacted pt, no answer

## 2021-07-07 NOTE — Telephone Encounter (Signed)
Any suggestions>?

## 2021-07-11 NOTE — Telephone Encounter (Signed)
Contacted pt again, Lvm rq call back

## 2021-07-13 NOTE — Telephone Encounter (Signed)
Pt returned call, informed him It can take up to 8 weeks at the full dose of Topamax for headaches to improve. Dr Billey Gosling suspect his sleep apnea is also contributing to his headaches so hopefully his Sleep evaluation will also be helpful. If he still doesn't have improvement at that time we can discuss alternate medication options. He was appreciative.

## 2021-08-14 ENCOUNTER — Other Ambulatory Visit: Payer: Self-pay | Admitting: Psychiatry

## 2021-08-18 ENCOUNTER — Institutional Professional Consult (permissible substitution): Payer: No Typology Code available for payment source | Admitting: Neurology

## 2021-08-23 ENCOUNTER — Ambulatory Visit: Payer: No Typology Code available for payment source | Admitting: Psychiatry

## 2021-08-25 ENCOUNTER — Encounter: Payer: Self-pay | Admitting: Gastroenterology

## 2021-09-16 ENCOUNTER — Ambulatory Visit (AMBULATORY_SURGERY_CENTER): Payer: No Typology Code available for payment source | Admitting: *Deleted

## 2021-09-16 ENCOUNTER — Other Ambulatory Visit: Payer: Self-pay

## 2021-09-16 VITALS — Ht 72.0 in | Wt 282.0 lb

## 2021-09-16 DIAGNOSIS — R112 Nausea with vomiting, unspecified: Secondary | ICD-10-CM

## 2021-09-16 DIAGNOSIS — R1310 Dysphagia, unspecified: Secondary | ICD-10-CM

## 2021-09-16 DIAGNOSIS — Z1211 Encounter for screening for malignant neoplasm of colon: Secondary | ICD-10-CM

## 2021-09-16 NOTE — Progress Notes (Signed)

## 2021-09-26 ENCOUNTER — Ambulatory Visit: Payer: No Typology Code available for payment source | Admitting: Psychiatry

## 2021-09-26 NOTE — Progress Notes (Deleted)
? ?  CC:  headaches ? ?Follow-up Visit ? ?Last visit: 05/23/21 ? 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 ? ?At his last visit, MRI/MRA brain was ordered. He was started on Topamax for prevention. He was referred to neck PT for cervicalgia. Referral to Sleep was placed for OSA management. ? ?Interval History: ?*** ? ?MRI and MRA were normal. ? ? ?Headache days per month: *** ?Headache free days per month: *** ?Headache severity: *** ? ?Current Headache Regimen: ?Preventative: *** ?Abortive: *** ? ?# of doses of abortive medications per month: *** ? ?Prior Therapies                                  ?*** ? ?Physical Exam:  ? ?Vital Signs: ?There were no vitals taken for this visit. ?GENERAL:  well appearing, in no acute distress, alert  ?SKIN:  Color, texture, turgor normal. No rashes or lesions ?HEAD:  Normocephalic/atraumatic. ?RESP: normal respiratory effort ?MSK:  No gross joint deformities.  ? ?NEUROLOGICAL: ?Mental Status: Alert, oriented to person, place and time, Follows commands, and Speech fluent and appropriate. ?Cranial Nerves: PERRL, face symmetric, no dysarthria, hearing grossly intact ?Motor: moves all extremities equally ?Gait: normal-based. ? ?IMPRESSION: ?*** ? ?PLAN: ?*** ? ? ?Follow-up: *** ? ?I spent a total of *** minutes on the date of the service. Headache education was done. Discussed lifestyle modification including increased oral hydration, decreased caffeine, exercise and stress management. Discussed treatment options including preventive and acute medications, natural supplements, and infusion therapy. Discussed medication overuse headache and to limit use of acute treatments to no more than 2 days/week or 10 days/month. Discussed medication side effects, adverse reactions and drug interactions. Written educational materials and patient instructions outlining all  of the above were given. ? ?Genia Harold, MD ? ?

## 2021-09-30 ENCOUNTER — Encounter: Payer: Self-pay | Admitting: Gastroenterology

## 2021-10-05 ENCOUNTER — Encounter: Payer: No Typology Code available for payment source | Admitting: Gastroenterology

## 2021-10-11 ENCOUNTER — Encounter: Payer: Self-pay | Admitting: Neurology

## 2021-10-11 ENCOUNTER — Ambulatory Visit: Payer: No Typology Code available for payment source | Admitting: Neurology

## 2021-10-11 VITALS — BP 92/58 | HR 71 | Ht 72.0 in | Wt 287.0 lb

## 2021-10-11 DIAGNOSIS — R519 Headache, unspecified: Secondary | ICD-10-CM

## 2021-10-11 DIAGNOSIS — G4719 Other hypersomnia: Secondary | ICD-10-CM

## 2021-10-11 DIAGNOSIS — E669 Obesity, unspecified: Secondary | ICD-10-CM

## 2021-10-11 DIAGNOSIS — R351 Nocturia: Secondary | ICD-10-CM

## 2021-10-11 DIAGNOSIS — Z8669 Personal history of other diseases of the nervous system and sense organs: Secondary | ICD-10-CM

## 2021-10-11 DIAGNOSIS — R634 Abnormal weight loss: Secondary | ICD-10-CM

## 2021-10-11 NOTE — Progress Notes (Signed)
Subjective:  ?  ?Patient ID: Dean Farmer is a 51 y.o. male. ? ?HPI ? ? ? ?Star Age, MD, PhD ?[facility] ?[address], [address] ?[phone] ?[place], [place] [address] ? ?Dear [name], ? ?History of patient, Dean Farmer, upon your kind request, in my sleep clinic today for initial consultation of his sleep disorder, in particular, evaluation of his prior diagnosis of obstructive sleep apnea.  The patient is unaccompanied today.  As you know, Mr. Dean Farmer is a 51 year old right-handed gentleman with an underlying medical history of diabetes, reflux disease, recurrent headaches, low vitamin D, hyperlipidemia, anxiety and obesity, who was previously diagnosed with obstructive sleep apnea.  He was on positive airway pressure treatment but no longer has a PAP machine.  I reviewed your office note from 05/23/2021.  His Epworth sleepiness score is 12/24, fatigue severity score is 47 out of 63.  His original sleep apnea diagnosis was made in the late 90s, he reports that he had severe sleep apnea at the time.  His CPAP pressure was around 14 as he recalls.  He had another sleep study may be in 2005 or thereabouts and as he recalls his CPAP pressure was around 11 at the time.  He has had weight fluctuation, his heaviest was 360 pounds, his lowest was 245 pounds.  He is working on weight loss.  He quit smoking about a year ago.  Working on caffeine reduction, he does drink diet [facility], about 3 cans/day on average and some coffee in the morning, no alcohol currently.  He lives with his wife and is 40 year old nephew, he has 1 daughter who is 64 and is currently [employer].  They have 2 dogs and 1 cat in the household, the pets typically sleep in the bedroom and on the bed with him.  He does have a TV on at night but turns it off before falling asleep.  Bedtime is generally between 10 and 11 and rise time around 6.  Has nocturia about once per average night and has recurrent morning headaches,  nearly daily for the past year or so.  He works a 2-2-3 day schedule, works from 7 AM to 7 PM, he works in a [employer].   ?No prior sleep study results are available for my review today.  Has not used CPAP in the past for 5 years, his machine broke and he also had some lapses in insurance coverage. ? ?His Past Medical History Is Significant For: ?Past Medical History:  ?Diagnosis Date  ? Anxiety   ? Diabetes mellitus without complication ([place])   ? GERD (gastroesophageal reflux disease)   ? Hypercholesteremia   ? Hyperlipidemia   ? Low vitamin D level   ? Sleep apnea   ? ? ?His Past Surgical History Is Significant For: ?Past Surgical History:  ?Procedure Laterality Date  ? CARPAL TUNNEL RELEASE    ? TENDON REPAIR    ? WISDOM TOOTH EXTRACTION    ? ? ?His Family History Is Significant For: ?Family History  ?Problem Relation Age of Onset  ? Diabetes Mother   ? Diabetes Father   ? Colon polyps Paternal Aunt   ?     x 2  ? Colon cancer Paternal Uncle   ? Stomach cancer Paternal Uncle   ? Autoimmune disease Daughter   ? Polycystic ovary syndrome Daughter   ? Esophageal cancer Neg Hx   ? Rectal cancer Neg Hx   ? Sleep apnea Neg Hx   ? ? ?  His Social History Is Significant For: ?Social History  ? ?Socioeconomic History  ? Marital status: Married  ?  Spouse name: Not on file  ? Number of children: 1  ? Years of education: Not on file  ? Highest education level: GED or equivalent  ?Occupational History  ? Occupation: Glass blower/designer  ?  Comment: [employer]  ?Tobacco Use  ? Smoking status: Former  ?  Packs/day: 1.00  ?  Types: Cigarettes  ?  Quit date: 2022  ?  Years since quitting: 1.2  ? Smokeless tobacco: Former  ?  Types: Chew  ?Vaping Use  ? Vaping Use: Never used  ?Substance and Sexual Activity  ? Alcohol use: Not Currently  ? Drug use: No  ? Sexual activity: Not on file  ?Other Topics Concern  ? Not on file  ?Social History Narrative  ? Caffeine- 5- 6 cups a day   ? Left handed  ?  Lives at home with wife   ? ?Social Determinants  of Health  ? ?Financial Resource Strain: Not on file  ?Food Insecurity: Not on file  ?Transportation Needs: Not on file  ?Physical Activity: Not on file  ?Stress: Not on file  ?Social Connections: Not on file  ? ? ?His Allergies Are:  ?Allergies  ?Allergen Reactions  ? Ozempic (0.25 Or 0.5 Mg-Dose) [Semaglutide(0.25 Or 0.84m-Dos)] Nausea And Vomiting  ?:  ? ?His Current Medications Are:  ?Outpatient Encounter Medications as of 10/11/2021  ?Medication Sig  ? atorvastatin (LIPITOR) 10 MG tablet Take 10 mg by mouth daily.  ? Dulaglutide (TRULICITY) 04.65MKC/1.2XNSOPN Inject into the skin.  ? empagliflozin (JARDIANCE) 25 MG TABS tablet Take 25 mg by mouth daily.  ? escitalopram (LEXAPRO) 10 MG tablet 20 mg.  ? Na Sulfate-K Sulfate-Mg Sulf (SUPREP BOWEL PREP KIT) 17.5-3.13-1.6 GM/177ML SOLN Take 1 kit by mouth as directed.  ? pioglitazone (ACTOS) 45 MG tablet Take 45 mg by mouth daily.  ? topiramate (TOPAMAX) 25 MG tablet TAKE 25 MG (1 PILL) AT BEDTIME FOR ONE WEEK, THEN INCREASE TO 50 MG (2 PILLS) AT BEDTIME FOR ONE WEEK, THEN TAKE 75 MG (3 PILLS) AT BEDTIME FOR ONE WEEK, THEN TAKE 100 MG (4 PILLS) AT BEDTIME  ? TRESIBA FLEXTOUCH 200 UNIT/ML FlexTouch Pen SMARTSIG:15 Unit(s) SUB-Q Daily  ? omeprazole (PRILOSEC) 40 MG capsule Take 1 capsule (40 mg total) by mouth daily.  ? sildenafil (VIAGRA) 50 MG tablet Take 50 mg by mouth daily as needed for erectile dysfunction. (Patient not taking: Reported on 10/11/2021)  ? ?No facility-administered encounter medications on file as of 10/11/2021.  ?: ? ? ?Review of Systems:  ?Out of a complete 14 point review of systems, all are reviewed and negative with the exception of these symptoms as listed below: ? ? ?Review of Systems  ?Neurological:   ?     Pt is here for Sleep Consult. Pt states he snores,headaches and fatigue throughout the day . Pt states his last sleep study was 2005.Pt states he did have a CPAP machine but doesn't have one now. Pt denies hypertension ? ?ESS:12 ?FSS :47   ? ?Objective:  ?Neurological Exam ? ?Physical Exam ?Physical Examination:  ? ?Vitals:  ? 10/11/21 1220  ?BP: (!) 92/58  ?Pulse: 71  ? ? ?General Examination: The patient is a very pleasant 51y.o. male in no acute distress. He appears well-developed and well-nourished and well groomed.  ? ?HEENT: Normocephalic, atraumatic, pupils are equal, round and reactive to light, extraocular tracking  is good without limitation to gaze excursion or nystagmus noted. Hearing is grossly intact. Face is symmetric with normal facial animation. Speech is clear with no dysarthria noted. There is no hypophonia. There is no lip, neck/head, jaw or voice tremor. Neck is supple with full range of passive and active motion. There are no carotid bruits on auscultation. Oropharynx exam reveals: mild mouth dryness, adequate dental hygiene, missing right upper front tooth, moderate airway crowding secondary to small airway entry, prominent uvula, tonsillar size of about 1-2+ bilaterally.  Tongue protrudes centrally and palate elevates symmetrically, no significant overbite.  Neck circumference of 17 5/8 inches.   ? ?Chest: Clear to auscultation without wheezing, rhonchi or crackles noted. ? ?Heart: S1+S2+0, regular and normal without murmurs, rubs or gallops noted.  ? ?Abdomen: Soft, non-tender and non-distended. ? ?Extremities: There is trace edema around both ankles.   ? ?Skin: Warm and dry without trophic changes noted.  ? ?Musculoskeletal: exam reveals no obvious joint deformities.  ? ?Neurologically:  ?Mental status: The patient is awake, alert and oriented in all 4 spheres. His immediate and remote memory, attention, language skills and fund of knowledge are appropriate. There is no evidence of aphasia, agnosia, apraxia or anomia. Speech is clear with normal prosody and enunciation. Thought process is linear. Mood is normal and affect is normal.  ?Cranial nerves II - XII are as described above under HEENT exam.  ?Motor exam: Normal bulk,  strength and tone is noted. There is no obvious tremor. Fine motor skills and coordination: grossly intact.  ?Cerebellar testing: No dysmetria or intention tremor. There is no truncal or gait ataxia.

## 2021-10-11 NOTE — Patient Instructions (Signed)
Thank you for choosing [facility] for your sleep related care! It was nice to meet you today!   Here is what we discussed today:    Based on your symptoms and your exam I believe you are still at risk for obstructive sleep apnea (aka OSA). We should proceed with a sleep study to determine whether you do or do not have OSA and how severe it is. Even, if you have mild OSA, I may want you to consider treatment with CPAP, as treatment of even borderline or mild sleep apnea can result and improvement of symptoms such as sleep disruption, daytime sleepiness, nighttime bathroom breaks, restless leg symptoms, improvement of headache syndromes, even improved mood disorder.   As explained, an attended sleep study (meaning you get to stay overnight in the sleep lab), lets us monitor sleep-related behaviors such as sleep talking and leg movements in sleep, in addition to monitoring for sleep apnea.  A home sleep test is a screening tool for sleep apnea diagnosis only, but unfortunately, does not help with any other sleep-related diagnoses.  Please remember, the long-term risks and ramifications of untreated moderate to severe obstructive sleep apnea may include (but are not limited to): increased risk for cardiovascular disease, including congestive heart failure, stroke, difficult to control hypertension, treatment resistant obesity, arrhythmias, especially irregular heartbeat commonly known as A. Fib. (atrial fibrillation); even type 2 diabetes has been linked to untreated OSA.   Other correlations that untreated obstructive sleep apnea include macular edema which is swelling of the retina in the eyes, droopy eyelid syndrome, and elevated hemoglobin and hematocrit levels (often referred to as polycythemia).  Sleep apnea can cause disruption of sleep and sleep deprivation in most cases, which, in turn, can cause recurrent headaches, problems with memory, mood, concentration, focus, and  vigilance. Most people with untreated sleep apnea report excessive daytime sleepiness, which can affect their ability to drive. Please do not drive or use heavy equipment or machinery, if you feel sleepy! Patients with sleep apnea can also develop difficulty initiating and maintaining sleep (aka insomnia).   Having sleep apnea may increase your risk for other sleep disorders, including involuntary behaviors sleep such as sleep terrors, sleep talking, sleepwalking.    Having sleep apnea can also increase your risk for restless leg syndrome and leg movements at night.   Please note that untreated obstructive sleep apnea may carry additional perioperative morbidity. Patients with significant obstructive sleep apnea (typically, in the moderate to severe degree) should receive, if possible, perioperative PAP (positive airway pressure) therapy and the surgeons and particularly the anesthesiologists should be informed of the diagnosis and the severity of the sleep disordered breathing.   We will call you or email you through MyChart with regards to your test results and plan a follow-up in sleep clinic accordingly. Most likely, you will hear from one of our nurses.   Our sleep lab administrative assistant will call you to schedule your sleep study and give you further instructions, regarding the check in process for the sleep study, arrival time, what to bring, when you can expect to leave after the study, etc., and to answer any other logistical questions you may have. If you don't hear back from her by about 2 weeks from now, please feel free to call her direct line at [phone] or you can call our general clinic number, or email us through My Chart.   

## 2021-10-12 ENCOUNTER — Telehealth: Payer: Self-pay | Admitting: Neurology

## 2021-10-12 ENCOUNTER — Telehealth: Payer: Self-pay

## 2021-10-12 NOTE — Telephone Encounter (Signed)
Pt called today and stated he would like to hold off on getting his sleep study done since he is switching insurance soon. Pt states he'll call back to schedule when he has the new information ?

## 2021-10-18 NOTE — Telephone Encounter (Signed)
error 

## 2021-10-20 ENCOUNTER — Telehealth: Payer: Self-pay | Admitting: Neurology

## 2021-10-20 NOTE — Telephone Encounter (Signed)
Larence Penning B 13 minutes ago (1:23 PM)  ? ?AJ ?Pt's current insurance information ?[employer] ?Member ID: [id number] ?Group# [id number] ?   ?  ?  ?Note   ? ?Kathrine Cords, Annie B 17 minutes ago (1:19 PM)  ? ?

## 2021-10-20 NOTE — Telephone Encounter (Signed)
This information has been copied straight into the sleep lab's phone note and sent to [facility].  ?

## 2021-10-20 NOTE — Telephone Encounter (Signed)
Pt's current insurance information ?[employer] ?Member ID: [id number] ?Group# [id number] ? ?

## 2021-11-07 ENCOUNTER — Ambulatory Visit: Payer: No Typology Code available for payment source | Admitting: Psychiatry

## 2021-11-07 VITALS — BP 126/80 | HR 74 | Ht 72.0 in | Wt 288.4 lb

## 2021-11-07 DIAGNOSIS — G4482 Headache associated with sexual activity: Secondary | ICD-10-CM

## 2021-11-07 DIAGNOSIS — G43009 Migraine without aura, not intractable, without status migrainosus: Secondary | ICD-10-CM | POA: Diagnosis not present

## 2021-11-07 MED ORDER — TOPIRAMATE 100 MG PO TABS: ORAL_TABLET | ORAL | 4 refills | Status: DC

## 2021-11-07 MED ORDER — INDOMETHACIN 25 MG PO CAPS: ORAL_CAPSULE | ORAL | 3 refills | Status: DC

## 2021-11-07 NOTE — Telephone Encounter (Signed)
Pt was in the office today for f/u with Dr. Billey Gosling and wanted to check status of sleep study.  ?He is ok with proceeding at this time.  ?

## 2021-11-07 NOTE — Telephone Encounter (Signed)
Noted  

## 2021-11-07 NOTE — Telephone Encounter (Signed)
It looks like we are still working on authorization, I will call patient as soon as we have an update. ?

## 2021-11-07 NOTE — Patient Instructions (Addendum)
Take 1-2 pills of indomethacin 30 minutes prior to sex to prevent sex headache. May also take as needed for sex headache or migraine ?Increase topiramate to 50 mg (1/2 pill) in the morning and 100 mg (1 pill) in the evening for one week. Then can increase to 100 mg twice a day ? ?

## 2021-11-07 NOTE — Progress Notes (Signed)
? ?  CC:  headaches ? ?Follow-up Visit ? ?Last visit: 05/23/21 ? ?Brief HPI: ?51 year old male with a history of DM, HLD, OSA (not on CPAP) who follows in clinic for chronic migraines following COVID infection. He also reports thunderclap headaches associated with sexual activity. ? ?At his last visit, MRI/MRA were ordered. He was started on Topamax for headache prevention. Referral to Sleep was placed to help manage his sleep apnea.  ? ?Interval History: ?Since his last visit headaches have improved in severity. They have gone from a 10/10 down to a 6/10. He continues to have a low grade headache almost every day. He is tolerating Topamax 100 mg QHS well without side effects. Takes OTCs 3 times per week for more severe headaches headaches. Has migraines 1-2 times per week.  ? ?Continues to have severe headaches associated with intercourse. He has been avoiding intercourse because of these headaches. ? ?He was planned for a sleep study but recently switched insurance so this was delayed. ? ?Has been having chest pain recently and is planning to schedule for evaluation with a cardiologist. ? ?MRI/MRA 06/20/21 were unremarkable. ? ?Headache days per month: 30 ?Headache free days per month: 0 ? ?Current Headache Regimen: ?Preventative: Topamax 100 mg QHS ?Abortive: Tylenol ? ?Prior Therapies                                  ?Topamax 100 mg QHS ?Lexapro 20 mg daily ?Cymbalta 30 mg BID ? ?Physical Exam:  ? ?Vital Signs: ?BP 126/80   Pulse 74   Ht 6' (1.829 m)   Wt 288 lb 6 oz (130.8 kg)   SpO2 96%   BMI 39.11 kg/m?  ?GENERAL:  well appearing, in no acute distress, alert  ?SKIN:  Color, texture, turgor normal. No rashes or lesions ?HEAD:  Normocephalic/atraumatic. ?RESP: normal respiratory effort ?MSK:  No gross joint deformities.  ? ?NEUROLOGICAL: ?Mental Status: Alert, oriented to person, place and time, Follows commands, and Speech fluent and appropriate. ?Cranial Nerves: PERRL, face symmetric, no dysarthria,  hearing grossly intact ?Motor: moves all extremities equally ?Gait: normal-based. ? ?IMPRESSION: ?51 year old male with a history of DM, HLD, OSA (not on CPAP) who presents for follow up of migraines and primary sex headaches. He has had improvement on Topamax but continues to have daily headaches. Will uptitrate Topamax and see if higher doses are more effective. Will start indomethacin 30 minutes prior to sex to help prevent sex headache. Will defer triptan at this time as he reports chest pain and is planning on undergoing Cardiology evaluation. Could consider triptan in the future of Cardiology evaluation is negative. ? ?PLAN: ?-Prevention: Increase Topamax to 50/100 x1 week, then can increase to 100 mg BID ?-Start indomethacin 25-50 mg, take 30 minutes prior to intercourse to prevent sex headache. May also take as needed for migraines ?-next steps: consider propranolol for prevention, consider PRN triptan if Cardiology evaluation negative ? ?Follow-up: 4 months ? ?I spent a total of 31 minutes on the date of the service. Headache education was done.  Discussed treatment options including preventive and acute medications. Discussed medication side effects, adverse reactions and drug interactions. Written educational materials and patient instructions outlining all of the above were given. ? ?Genia Harold, MD ?11/07/21 ?2:17 PM ? ?

## 2021-11-17 ENCOUNTER — Ambulatory Visit
Admission: RE | Admit: 2021-11-17 | Discharge: 2021-11-17 | Disposition: A | Discharge status: Home / Self Care | Payer: No Typology Code available for payment source | Source: Ambulatory Visit | Attending: Sports Medicine | Admitting: Sports Medicine

## 2021-11-17 ENCOUNTER — Other Ambulatory Visit: Payer: Self-pay | Admitting: Sports Medicine

## 2021-11-17 DIAGNOSIS — M25551 Pain in right hip: Secondary | ICD-10-CM

## 2021-11-17 DIAGNOSIS — M25562 Pain in left knee: Secondary | ICD-10-CM

## 2021-11-22 ENCOUNTER — Encounter: Payer: Self-pay | Admitting: Gastroenterology

## 2021-11-22 ENCOUNTER — Ambulatory Visit (AMBULATORY_SURGERY_CENTER): Payer: No Typology Code available for payment source | Admitting: Gastroenterology

## 2021-11-22 VITALS — BP 137/75 | HR 61 | Temp 96.6°F | Resp 13 | Ht 71.0 in | Wt 282.0 lb

## 2021-11-22 DIAGNOSIS — R1310 Dysphagia, unspecified: Secondary | ICD-10-CM

## 2021-11-22 DIAGNOSIS — B9681 Helicobacter pylori [H. pylori] as the cause of diseases classified elsewhere: Secondary | ICD-10-CM | POA: Diagnosis not present

## 2021-11-22 DIAGNOSIS — R12 Heartburn: Secondary | ICD-10-CM

## 2021-11-22 DIAGNOSIS — K2970 Gastritis, unspecified, without bleeding: Secondary | ICD-10-CM | POA: Diagnosis not present

## 2021-11-22 DIAGNOSIS — Z1211 Encounter for screening for malignant neoplasm of colon: Secondary | ICD-10-CM | POA: Diagnosis present

## 2021-11-22 DIAGNOSIS — D123 Benign neoplasm of transverse colon: Secondary | ICD-10-CM

## 2021-11-22 DIAGNOSIS — D124 Benign neoplasm of descending colon: Secondary | ICD-10-CM | POA: Diagnosis not present

## 2021-11-22 MED ORDER — SODIUM CHLORIDE 0.9 % IV SOLN: 500.0000 mL | Freq: Once | INTRAVENOUS | Status: DC

## 2021-11-22 MED ORDER — OMEPRAZOLE 40 MG PO CPDR: 40.0000 mg | DELAYED_RELEASE_CAPSULE | Freq: Every day | ORAL | 3 refills | Status: DC

## 2021-11-22 NOTE — Progress Notes (Signed)
HPI: This is a man whom I saw in the office a little over a year ago  Routine risk for CRC Has GERD, dysphagia   ROS: complete GI ROS as described in HPI, all other review negative.  Constitutional:  No unintentional weight loss   Past Medical History:  Diagnosis Date   Anxiety    Diabetes mellitus without complication (HCC)    GERD (gastroesophageal reflux disease)    Hypercholesteremia    Hyperlipidemia    Low vitamin D level    Sleep apnea     Past Surgical History:  Procedure Laterality Date   CARPAL TUNNEL RELEASE     TENDON REPAIR     WISDOM TOOTH EXTRACTION      Current Outpatient Medications  Medication Sig Dispense Refill   atorvastatin (LIPITOR) 10 MG tablet Take 10 mg by mouth daily.     Dulaglutide (TRULICITY) 2.84 XL/2.4MW SOPN Inject 1.5 mg into the skin.     empagliflozin (JARDIANCE) 25 MG TABS tablet Take 25 mg by mouth daily.     escitalopram (LEXAPRO) 10 MG tablet 20 mg.     meloxicam (MOBIC) 15 MG tablet 1 tablet     pioglitazone (ACTOS) 45 MG tablet Take 45 mg by mouth daily.     topiramate (TOPAMAX) 100 MG tablet Take 1/2 tablet in the morning and 1 tablet at bedtime for one week, then increase to 1 tablet twice a day 60 tablet 4   TRESIBA FLEXTOUCH 200 UNIT/ML FlexTouch Pen SMARTSIG:15 Unit(s) SUB-Q Daily     indomethacin (INDOCIN) 25 MG capsule Take one pill 30 minutes prior to intercourse to prevent sex headache 30 capsule 3   Current Facility-Administered Medications  Medication Dose Route Frequency Provider Last Rate Last Admin   0.9 %  sodium chloride infusion  500 mL Intravenous Once Milus Banister, MD        Allergies as of 11/22/2021 - Review Complete 11/22/2021  Allergen Reaction Noted   Ozempic (0.25 or 0.5 mg-dose) [semaglutide(0.25 or 0.'5mg'$ -dos)] Nausea And Vomiting 09/20/2020   Semaglutide Nausea And Vomiting 09/20/2020    Family History  Problem Relation Age of Onset   Diabetes Mother    Diabetes Father    Colon polyps  Paternal Aunt        x 2   Colon cancer Paternal Uncle    Stomach cancer Paternal Uncle    Autoimmune disease Daughter    Polycystic ovary syndrome Daughter    Esophageal cancer Neg Hx    Rectal cancer Neg Hx    Sleep apnea Neg Hx     Social History   Socioeconomic History   Marital status: Married    Spouse name: Not on file   Number of children: 1   Years of education: Not on file   Highest education level: GED or equivalent  Occupational History   Occupation: Glass blower/designer    Comment: [employer]  Tobacco Use   Smoking status: Former    Packs/day: 1.00    Types: Cigarettes    Quit date: 2022    Years since quitting: 1.3   Smokeless tobacco: Former    Types: Nurse, children's Use: Never used  Substance and Sexual Activity   Alcohol use: Not Currently   Drug use: No   Sexual activity: Not on file  Other Topics Concern   Not on file  Social History Narrative   Caffeine- 5- 6 cups a day    Left handed  Lives at home with wife    Social Determinants of Health   Financial Resource Strain: Not on file  Food Insecurity: Not on file  Transportation Needs: Not on file  Physical Activity: Not on file  Stress: Not on file  Social Connections: Not on file  Intimate Partner Violence: Not on file     Physical Exam: BP 133/67   Pulse 74   Temp (!) 96.6 F (35.9 C)   Ht '5\' 11"'$  (1.803 m)   Wt 282 lb (127.9 kg)   SpO2 100%   BMI 39.33 kg/m  Constitutional: generally well-appearing Psychiatric: alert and oriented x3 Lungs: CTA bilaterally Heart: no MCR  Assessment and plan: 51 y.o. male with routine risk for CRC, gerd, dysphgai  Colonoscoy and EGD  Care is appropriate for the ambulatory setting.  Owens Loffler, MD [facility] 11/22/2021, 10:27 AM

## 2021-11-22 NOTE — Progress Notes (Signed)
Called to room to assist during endoscopic procedure.  Patient ID and intended procedure confirmed with present staff. Received instructions for my participation in the procedure from the performing physician.  

## 2021-11-22 NOTE — Op Note (Signed)
Dean Farmer Procedure Date: 11/22/2021 10:29 AM MRN: [id number] Endoscopist: Milus Banister , MD Age: 51 Referring MD:  Date of Birth: Dec 21, 1970 Gender: Male Account #: [id number] Procedure:                Colonoscopy Indications:              Screening for colorectal malignant neoplasm Medicines:                Monitored Anesthesia Care Procedure:                Pre-Anesthesia Assessment:                           - Prior to the procedure, a History and Physical                            was performed, and patient medications and                            allergies were reviewed. The patient's tolerance of                            previous anesthesia was also reviewed. The risks                            and benefits of the procedure and the sedation                            options and risks were discussed with the patient.                            All questions were answered, and informed consent                            was obtained. Prior Anticoagulants: The patient has                            taken no previous anticoagulant or antiplatelet                            agents. ASA Grade Assessment: III - A patient with                            severe systemic disease. After reviewing the risks                            and benefits, the patient was deemed in                            satisfactory condition to undergo the procedure.                           After obtaining informed consent, the colonoscope  was passed under direct vision. Throughout the                            procedure, the patient's blood pressure, pulse, and                            oxygen saturations were monitored continuously. The                            Olympus [id number]) Colonoscope was                            introduced through the anus and advanced to the the                            cecum, identified  by appendiceal orifice and                            ileocecal valve. The colonoscopy was performed                            without difficulty. The patient tolerated the                            procedure well. The quality of the bowel                            preparation was good. The ileocecal valve,                            appendiceal orifice, and rectum were photographed. Scope In: 10:34:37 AM Scope Out: 10:45:20 AM Scope Withdrawal Time: 0 hours 8 minutes 54 seconds  Total Procedure Duration: 0 hours 10 minutes 43 seconds  Findings:                 Two sessile polyps were found in the descending                            colon and transverse colon. The polyps were 2 to 5                            mm in size. These polyps were removed with a cold                            snare. Resection and retrieval were complete.                           The exam was otherwise without abnormality on                            direct and retroflexion views. Complications:            No immediate complications. Estimated blood loss:  None. Estimated Blood Loss:     Estimated blood loss: none. Impression:               - Two 2 to 5 mm polyps in the descending colon and                            in the transverse colon, removed with a cold snare.                            Resected and retrieved.                           - The examination was otherwise normal on direct                            and retroflexion views. Recommendation:           - Await pathology results.                           - EGD now. Milus Banister, MD 11/22/2021 10:47:52 AM This report has been signed electronically.

## 2021-11-22 NOTE — Progress Notes (Signed)
Report to PACU, RN, vss, BBS= Clear.  

## 2021-11-22 NOTE — Patient Instructions (Signed)
Please read handouts provided. Continue present medications. Await pathology results. Begin omeprazole 40 mg, one pill 20-30 minutes before breakfast meal daily.   YOU HAD AN ENDOSCOPIC PROCEDURE TODAY AT [facility]:   Refer to the procedure report that was given to you for any specific questions about what was found during the examination.  If the procedure report does not answer your questions, please call your gastroenterologist to clarify.  If you requested that your care partner not be given the details of your procedure findings, then the procedure report has been included in a sealed envelope for you to review at your convenience later.  YOU SHOULD EXPECT: Some feelings of bloating in the abdomen. Passage of more gas than usual.  Walking can help get rid of the air that was put into your GI tract during the procedure and reduce the bloating. If you had a lower endoscopy (such as a colonoscopy or flexible sigmoidoscopy) you may notice spotting of blood in your stool or on the toilet paper. If you underwent a bowel prep for your procedure, you may not have a normal bowel movement for a few days.  Please Note:  You might notice some irritation and congestion in your nose or some drainage.  This is from the oxygen used during your procedure.  There is no need for concern and it should clear up in a day or so.  SYMPTOMS TO REPORT IMMEDIATELY:  Following lower endoscopy (colonoscopy or flexible sigmoidoscopy):  Excessive amounts of blood in the stool  Significant tenderness or worsening of abdominal pains  Swelling of the abdomen that is new, acute  Fever of 100F or higher  Following upper endoscopy (EGD)  Vomiting of blood or coffee ground material  New chest pain or pain under the shoulder blades  Painful or persistently difficult swallowing  New shortness of breath  Fever of 100F or higher  Black, tarry-looking stools  For urgent or emergent issues, a  gastroenterologist can be reached at any hour by calling [phone]. Do not use MyChart messaging for urgent concerns.    DIET:  We do recommend a small meal at first, but then you may proceed to your regular diet.  Drink plenty of fluids but you should avoid alcoholic beverages for 24 hours.  ACTIVITY:  You should plan to take it easy for the rest of today and you should NOT DRIVE or use heavy machinery until tomorrow (because of the sedation medicines used during the test).    FOLLOW UP: Our staff will call the number listed on your records 48-72 hours following your procedure to check on you and address any questions or concerns that you may have regarding the information given to you following your procedure. If we do not reach you, we will leave a message.  We will attempt to reach you two times.  During this call, we will ask if you have developed any symptoms of COVID 19. If you develop any symptoms (ie: fever, flu-like symptoms, shortness of breath, cough etc.) before then, please call [phone].  If you test positive for Covid 19 in the 2 weeks post procedure, please call and report this information to [place].    If any biopsies were taken you will be contacted by phone or by letter within the next 1-3 weeks.  Please call us at [phone] if you have not heard about the biopsies in 3 weeks.    SIGNATURES/CONFIDENTIALITY: You and/or your care partner have signed paperwork  which will be entered into your electronic medical record.  These signatures attest to the fact that that the information above on your After Visit Summary has been reviewed and is understood.  Full responsibility of the confidentiality of this discharge information lies with you and/or your care-partner.

## 2021-11-22 NOTE — Op Note (Signed)
[place] Patient Name: Dean Farmer Procedure Date: 11/22/2021 10:29 AM MRN: [id number] Endoscopist: Milus Banister , MD Age: 51 Referring MD:  Date of Birth: [birth year] Gender: Male Account #: [id number] Procedure:                Upper GI endoscopy Indications:              Dysphagia, Heartburn Medicines:                Monitored Anesthesia Care Procedure:                Pre-Anesthesia Assessment:                           - Prior to the procedure, a History and Physical                            was performed, and patient medications and                            allergies were reviewed. The patient's tolerance of                            previous anesthesia was also reviewed. The risks                            and benefits of the procedure and the sedation                            options and risks were discussed with the patient.                            All questions were answered, and informed consent                            was obtained. Prior Anticoagulants: The patient has                            taken no previous anticoagulant or antiplatelet                            agents. ASA Grade Assessment: III - A patient with                            severe systemic disease. After reviewing the risks                            and benefits, the patient was deemed in                            satisfactory condition to undergo the procedure.                           After obtaining informed consent, the endoscope was  passed under direct vision. Throughout the                            procedure, the patient's blood pressure, pulse, and                            oxygen saturations were monitored continuously. The                            GIF [id number] was introduced through the                            mouth, and advanced to the second part of duodenum.                            The upper GI endoscopy was  accomplished without                            difficulty. The patient tolerated the procedure                            well. Scope In: Scope Out: Findings:                 Moderate inflammation characterized by erythema,                            friability and granularity was found in the entire                            examined stomach. Biopsies were taken with a cold                            forceps for histology.                           The exam was otherwise without abnormality. Complications:            No immediate complications. Estimated blood loss:                            None. Estimated Blood Loss:     Estimated blood loss: none. Impression:               - Moderate, non-specific gastritis. Biopsied to                            check for H. pylori.                           - The examination was otherwise normal. Recommendation:           - Patient has a contact number available for                            emergencies. The signs and symptoms of potential  delayed complications were discussed with the                            patient. Return to normal activities tomorrow.                            Written discharge instructions were provided to the                            patient.                           - Resume previous diet.                           - Continue present medications. New prescription                            writtent today; omeprazole '40mg'$  pills, one pill                            20-30 min before breakfast meal daily, disp 3                            months, 3 refills.                           - Await pathology results. Milus Banister, MD 11/22/2021 10:54:59 AM This report has been signed electronically.

## 2021-11-22 NOTE — Progress Notes (Signed)
Pt's states no medical or surgical changes since previsit or office visit. 

## 2021-11-23 ENCOUNTER — Telehealth: Payer: Self-pay | Admitting: *Deleted

## 2021-11-23 NOTE — Telephone Encounter (Signed)
  Follow up Call-     11/22/2021   10:13 AM  Call back number  Post procedure Call Back phone  # [phone]  Permission to leave phone message Yes    No answer at 2nd attempt follow up phone call.  Left message on voicemail.

## 2021-11-25 ENCOUNTER — Telehealth: Payer: Self-pay | Admitting: Neurology

## 2021-11-25 NOTE — Telephone Encounter (Signed)
HST- [employer] no Josem Kaufmann req spoke to Sam ref # [id number] patient is scheduled to pick up on 12/05/21 at 11 AM.

## 2021-11-30 ENCOUNTER — Other Ambulatory Visit: Payer: Self-pay

## 2021-11-30 ENCOUNTER — Telehealth: Payer: Self-pay | Admitting: Gastroenterology

## 2021-11-30 MED ORDER — BIS SUBCIT-METRONID-TETRACYC 140-125-125 MG PO CAPS: 3.0000 | ORAL_CAPSULE | Freq: Three times a day (TID) | ORAL | 0 refills | Status: DC

## 2021-11-30 NOTE — Telephone Encounter (Signed)
Phone call returned to the patient. Results discussed.  Rx for Pylera sent to pharmacy.

## 2021-11-30 NOTE — Telephone Encounter (Signed)
Patient returned your call about results, please advise.

## 2021-12-05 ENCOUNTER — Ambulatory Visit: Payer: No Typology Code available for payment source | Admitting: Neurology

## 2021-12-05 DIAGNOSIS — R519 Headache, unspecified: Secondary | ICD-10-CM

## 2021-12-05 DIAGNOSIS — R351 Nocturia: Secondary | ICD-10-CM

## 2021-12-05 DIAGNOSIS — E669 Obesity, unspecified: Secondary | ICD-10-CM

## 2021-12-05 DIAGNOSIS — G4733 Obstructive sleep apnea (adult) (pediatric): Secondary | ICD-10-CM | POA: Diagnosis not present

## 2021-12-05 DIAGNOSIS — Z8669 Personal history of other diseases of the nervous system and sense organs: Secondary | ICD-10-CM

## 2021-12-05 DIAGNOSIS — R634 Abnormal weight loss: Secondary | ICD-10-CM

## 2021-12-05 DIAGNOSIS — G4719 Other hypersomnia: Secondary | ICD-10-CM

## 2021-12-08 NOTE — Procedures (Signed)
   [facility]  HOME SLEEP TEST (Watch PAT) REPORT  STUDY DATE: 12/05/2021  DOB: [birth year]  MRN: [id number]  ORDERING CLINICIAN: Star Age, MD, PhD   REFERRING CLINICIAN: Dr. Genia Harold  CLINICAL INFORMATION/HISTORY: 51 year old right-handed gentleman with an underlying medical history of diabetes, reflux disease, recurrent headaches, low vitamin D, hyperlipidemia, anxiety and obesity, who was previously diagnosed with obstructive sleep apnea.  He was on positive airway pressure treatment but no longer has a PAP machine.   Epworth sleepiness score: 12/24.  BMI: 38.8 kg/m  FINDINGS:   Sleep Summary:   Total Recording Time (hours, min): 10 hours, 8 minutes  Total Sleep Time (hours, min):  9 hours, 17 minutes   Percent REM (%):    18.2%   Respiratory Indices:   Calculated pAHI (per hour):  21.1/hour         REM pAHI:    22.5/hour       NREM pAHI: 20.7/hour  Oxygen Saturation Statistics:    Oxygen Saturation (%) Mean: 95%   Minimum oxygen saturation (%):                 85%   O2 Saturation Range (%): 85-100%    O2 Saturation (minutes) <=88%: 0.8 min  Pulse Rate Statistics:   Pulse Mean (bpm):    64/min    Pulse Range (48-93/min)   IMPRESSION: OSA (obstructive sleep apnea)   RECOMMENDATION:  This home sleep test demonstrates moderate obstructive sleep apnea with a total AHI of 21.1/hour and O2 nadir of 85%.  Mild to moderate snoring was detected.  Treatment with positive airway pressure is recommended. The patient will be advised to proceed with an autoPAP titration/trial at home for now. A full night titration study may be considered to optimize treatment settings, if needed down the road.  Alternative treatment options may include dental treatment with an oral appliance through dentistry or orthodontics or surgical treatment with Inspire, hypoglossal nerve stimulator, in selected patients.  Concomitant weight loss is highly recommended.   Please note that untreated obstructive sleep apnea may carry additional perioperative morbidity. Patients with significant obstructive sleep apnea should receive perioperative PAP therapy and the surgeons and particularly the anesthesiologist should be informed of the diagnosis and the severity of the sleep disordered breathing. The patient should be cautioned not to drive, work at heights, or operate dangerous or heavy equipment when tired or sleepy. Review and reiteration of good sleep hygiene measures should be pursued with any patient. Other causes of the patient's symptoms, including circadian rhythm disturbances, an underlying mood disorder, medication effect and/or an underlying medical problem cannot be ruled out based on this test. Clinical correlation is recommended. The patient and his referring provider will be notified of the test results. The patient will be seen in follow up in sleep clinic at [facility].  I certify that I have reviewed the raw data recording prior to the issuance of this report in accordance with the standards of the American Academy of Sleep Medicine (AASM).  INTERPRETING PHYSICIAN:   Star Age, MD, PhD  Board Certified in Neurology and Sleep Medicine  [facility] [address], [place], [place] [address] [phone]

## 2021-12-08 NOTE — Progress Notes (Signed)
See procedure note.

## 2021-12-08 NOTE — Addendum Note (Signed)
Addended by: Star Age on: 12/08/2021 03:03 PM   Modules accepted: Orders

## 2021-12-12 ENCOUNTER — Telehealth: Payer: Self-pay | Admitting: *Deleted

## 2021-12-12 ENCOUNTER — Encounter: Payer: Self-pay | Admitting: *Deleted

## 2021-12-12 NOTE — Telephone Encounter (Signed)
-----   Message from Star Age, MD sent at 12/08/2021  3:03 PM EDT ----- Patient referred by Dr. Billey Gosling, seen by me on 10/11/2021 for reevaluation of his OSA, patient had a HST on 12/05/2021.    Please call and notify the patient that the recent home sleep test showed obstructive sleep apnea in the moderate range. I recommend treatment in the form of autoPAP, which means, that we don't have to bring him in for a sleep study with CPAP, but will let him start using a so called autoPAP machine at home, which is a CPAP-like machine with self-adjusting pressures. We will send the order to a local DME company (of his choice, or as per insurance requirement). The DME representative will fit him with a mask, educate him on how to use the machine, how to put the mask on, etc. I have placed an order in the chart. Please send the order, talk to patient, send report to referring MD. We will need a FU in sleep clinic for 10 weeks post-PAP set up, please arrange that with me or one of our NPs. Also reinforce the need for compliance with treatment. Thanks,   Star Age, MD, PhD [facility])

## 2021-12-12 NOTE — Telephone Encounter (Signed)
I called pt. I advised pt that Dr. Rexene Alberts reviewed their sleep study results and found that pt has moderate OSA . Dr. Rexene Alberts recommends that pt start APAP. I reviewed PAP compliance expectations with the pt. Pt is agreeable to starting an auto-PAP. I advised pt that an order will be sent to a DME, [facility], and they will call the pt within about one week after they file with the pt's insurance. [facility] will show the pt how to use the machine, fit for masks, and troubleshoot the auto-PAP if needed. A follow up appt was made for insurance purposes with Dr. Rexene Alberts on 02-22-2022 at [place]. Pt verbalized understanding to arrive 15 minutes early and bring their auto-PAP. A letter with all of this information in it will be mailed to the pt as a reminder. I verified with the pt that the address we have on file is correct. Pt verbalized understanding of results. Pt had no questions at this time but was encouraged to call back if questions arise. I have sent the order to [facility]. and have received confirmation that they have received the order.

## 2021-12-12 NOTE — Telephone Encounter (Signed)
-----   Message from Star Age, MD sent at 12/08/2021  3:03 PM EDT ----- Patient referred by Dr. Billey Gosling, seen by me on 10/11/2021 for reevaluation of his OSA, patient had a HST on 12/05/2021.    Please call and notify the patient that the recent home sleep test showed obstructive sleep apnea in the moderate range. I recommend treatment in the form of autoPAP, which means, that we don't have to bring him in for a sleep study with CPAP, but will let him start using a so called autoPAP machine at home, which is a CPAP-like machine with self-adjusting pressures. We will send the order to a local DME company (of his choice, or as per insurance requirement). The DME representative will fit him with a mask, educate him on how to use the machine, how to put the mask on, etc. I have placed an order in the chart. Please send the order, talk to patient, send report to referring MD. We will need a FU in sleep clinic for 10 weeks post-PAP set up, please arrange that with me or one of our NPs. Also reinforce the need for compliance with treatment. Thanks,   Star Age, MD, PhD [facility])

## 2021-12-12 NOTE — Telephone Encounter (Signed)
Called pt & LVM w/ office number asking for call back when able.

## 2021-12-19 ENCOUNTER — Encounter: Payer: Self-pay | Admitting: Cardiology

## 2021-12-19 ENCOUNTER — Ambulatory Visit: Payer: No Typology Code available for payment source | Admitting: Cardiology

## 2021-12-19 VITALS — BP 122/74 | HR 75 | Ht 72.0 in | Wt 284.0 lb

## 2021-12-19 DIAGNOSIS — E1169 Type 2 diabetes mellitus with other specified complication: Secondary | ICD-10-CM

## 2021-12-19 DIAGNOSIS — Z794 Long term (current) use of insulin: Secondary | ICD-10-CM

## 2021-12-19 DIAGNOSIS — E785 Hyperlipidemia, unspecified: Secondary | ICD-10-CM

## 2021-12-19 DIAGNOSIS — E119 Type 2 diabetes mellitus without complications: Secondary | ICD-10-CM

## 2021-12-19 DIAGNOSIS — R072 Precordial pain: Secondary | ICD-10-CM | POA: Diagnosis not present

## 2021-12-19 MED ORDER — METOPROLOL TARTRATE 50 MG PO TABS: 100.0000 mg | ORAL_TABLET | Freq: Once | ORAL | 0 refills | Status: DC

## 2021-12-19 NOTE — Progress Notes (Incomplete)
Primary Care Provider: London Pepper, MD [facility] Cardiologist: None Electrophysiologist: None  Clinic Note: No chief complaint on file.   ===================================  ASSESSMENT/PLAN   Problem List Items Addressed This Visit   None   ===================================  HPI:    Dean Farmer is an obese 51 y.o. male with history of DM-2, HLD, OSA (recently diagnosed-pending CPAP evaluation) who is being seen today for the evaluation of Chest Tightness and Pressure at the request of London Pepper, MD.  Dean Farmer was seen on 10/10/2021 by Dr. Orland Mustard with complaints of flulike symptoms for 3 weeks-cough, runny nose, chest congestion as well as chest heaviness.  There is also fatigue.  Some tingling of the chest discomfort down the left arm. => Started on Z-Pak and referred to cardiology because of the chest tightness.  Recent Hospitalizations: None  Reviewed  CV studies:    The following studies were reviewed today: (if available, images/films reviewed: From Epic Chart or Care Everywhere) None:  Interval History:   Dean Farmer presents here today for evaluation stating that Dean Farmer is recovered from his bronchitis but Dean Farmer still having the chest tightness in fact Dean Farmer says this been going on for about 6 months just getting worse.  It used to happen just with more vigorous activity but now is happening with modest activity.  Occurs usually when Dean Farmer is active either at work or trying to do some type of housework or more notably during sex.  Dean Farmer says the episodes lasted maybe a minute although sometimes it can be a little longer.  Dean Farmer is relatively poor historian, intensive go back and forth between different symptoms.  A lot of things that Dean Farmer describes revolve around the fact that Dean Farmer has chronic headaches that really limiting to him.  Dean Farmer definitely gets headaches with more vigorous activity including sex and Dean Farmer thinks it may be related to his chest pain.  Dean Farmer also mentions  that when Dean Farmer was age 36 Dean Farmer had a panic attack and went to the emergency room.  While in the emergency room hooked up to monitors Dean Farmer felt some discomfort in his chest and stated that the whole team came into the room asking QUESTIONS seeing Dean Farmer is doing indicating that something happened on the monitor.  Dean Farmer has no idea what was seen or what was diagnosed but Dean Farmer now has severe eczema yes just discomfort.  Since that time Dean Farmer has had intermittent episodes of the sharp stabbing central chest discomfort symptoms that are usually less than a minute.  The tightness Dean Farmer is describing now is different than that but they can both occur at the same time.  Dean Farmer is being evaluated for sleep apnea so Dean Farmer does have some orthopnea symptoms but more because of his obesity.  Dean Farmer denies any real PND.  Trivial ankle edema.  Only really noticed that in the right foot.  I asked about palpitations and Dean Farmer mentions this sharp discomfort episodes which may be his sensation of palpitations but Dean Farmer denies any rapid irregular heartbeats with associated dizziness.  What Dean Farmer does note is that Dean Farmer gets dizzy and lightheaded with this chest tightness and short of breath.  Dean Farmer also notes that whenever Dean Farmer does anything more than usual vigorous activity Dean Farmer will get lightheaded and dizzy.  While Dean Farmer denies any true syncope, Dean Farmer has had some near syncopal type episodes, associated with these dizziness spells.  No TIA or amaurosis fugax.  No claudication.  REVIEWED OF SYSTEMS  Review of Systems  Constitutional:  Positive for malaise/fatigue. Negative for weight loss.  HENT:  Negative for congestion and sinus pain.   Eyes:  Negative for blurred vision.  Respiratory:  Positive for shortness of breath (With exertion). Negative for cough (His cough from April has resolved.).   Cardiovascular:        Per HPI  Gastrointestinal:  Negative for blood in stool, constipation and melena.  Genitourinary:  Negative for flank pain, frequency and hematuria.   Musculoskeletal:  Positive for joint pain. Negative for back pain and falls.  Neurological:  Positive for dizziness and headaches (Chronic persistent). Negative for speech change, focal weakness and weakness.  Psychiatric/Behavioral:  Negative for depression (I think Dean Farmer may be over the depression but still has tendencies.) and memory loss. The patient is nervous/anxious. The patient does not have insomnia.     I have reviewed and (if needed) personally updated the patient's problem list, medications, allergies, past medical and surgical history, social and family history.   PAST MEDICAL HISTORY   Past Medical History:  Diagnosis Date  . Anxiety   . Diabetes mellitus without complication ([place])   . GERD (gastroesophageal reflux disease)   . Hypercholesteremia   . Hyperlipidemia   . Low vitamin D level   . Sleep apnea     PAST SURGICAL HISTORY   Past Surgical History:  Procedure Laterality Date  . CARPAL TUNNEL RELEASE    . TENDON REPAIR    . WISDOM TOOTH EXTRACTION       There is no immunization history on file for this patient.  MEDICATIONS/ALLERGIES   Current Meds  Medication Sig  . atorvastatin (LIPITOR) 10 MG tablet Take 10 mg by mouth daily.  . Dulaglutide (TRULICITY) 0.93 GH/8.2XH SOPN Inject 1.5 mg into the skin.  Marland Kitchen empagliflozin (JARDIANCE) 25 MG TABS tablet Take 25 mg by mouth daily.  Marland Kitchen escitalopram (LEXAPRO) 10 MG tablet 20 mg.  . indomethacin (INDOCIN) 25 MG capsule Take one pill 30 minutes prior to intercourse to prevent sex headache  . omeprazole (PRILOSEC) 40 MG capsule Take 1 capsule (40 mg total) by mouth daily. Take one pill 20-30 minutes before breakfast meal daily  . pioglitazone (ACTOS) 45 MG tablet Take 45 mg by mouth daily.  Marland Kitchen topiramate (TOPAMAX) 100 MG tablet Take 1/2 tablet in the morning and 1 tablet at bedtime for one week, then increase to 1 tablet twice a day  . TRESIBA FLEXTOUCH 200 UNIT/ML FlexTouch Pen SMARTSIG:15 Unit(s) SUB-Q Daily     Allergies  Allergen Reactions  . Ozempic (0.25 Or 0.5 Mg-Dose) [Semaglutide(0.25 Or 0.'5mg'$ -Dos)] Nausea And Vomiting  . Semaglutide Nausea And Vomiting    SOCIAL HISTORY/FAMILY HISTORY   Reviewed in Epic:   Social History   Tobacco Use  . Smoking status: Former    Packs/day: 1.00    Types: Cigarettes    Quit date: 12/19/2020    Years since quitting: 1.0  . 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 Vaping Use: Never used  Substance Use Topics  . Alcohol use: Not Currently    Comment: Rare alcohol-beer wine and liquor.  . Drug use: No   Social History   Social History Narrative   Caffeine- 5- 6 cups a day    Left handed    Lives at home with wife-1 daughter      No structural exercise.  Walks some.      Family History  Problem Relation Age of  Onset  . Diabetes Mother   . Cancer - Other Mother   . COPD Mother   . Dementia Mother   . Diabetes Father   . Heart attack Father   . Autoimmune disease Daughter   . Polycystic ovary syndrome Daughter   . Colon polyps Paternal Aunt        x 2  . Colon cancer Paternal Uncle   . Stomach cancer Paternal Uncle   . Diabetes Other   . Heart disease Other   . Esophageal cancer Neg Hx   . Rectal cancer Neg Hx   . Sleep apnea Neg Hx     OBJCTIVE -PE, EKG, labs   Wt Readings from Last 3 Encounters:  12/19/21 284 lb (128.8 kg)  11/22/21 282 lb (127.9 kg)  11/07/21 288 lb 6 oz (130.8 kg)    Physical Exam: BP 122/74   Pulse 75   Ht 6' (1.829 m)   Wt 284 lb (128.8 kg)   SpO2 99%   BMI 38.52 kg/m  Physical Exam Vitals reviewed.  Constitutional:      General: Dean Farmer is not in acute distress.    Appearance: Dean Farmer is ill-appearing (Mild chronically ill-appearing). Dean Farmer is not toxic-appearing.     Comments: Morbidly obese with BMI of 38.5 and risk factors of hyperlipidemia and diabetes.  HENT:     Head: Normocephalic and atraumatic.     Comments: Beard Eyes:     Extraocular Movements: Extraocular movements  intact.     Conjunctiva/sclera: Conjunctivae normal.     Pupils: Pupils are equal, round, and reactive to light.     Comments: Wears glasses.  Neck:     Vascular: No carotid bruit or JVD.  Cardiovascular:     Rate and Rhythm: Normal rate and regular rhythm. No extrasystoles are present.    Chest Wall: PMI is not displaced.     Pulses: Intact distal pulses. Decreased pulses (Diminished due to body habitus but palpable.).     Heart sounds: S1 normal and S2 normal. Heart sounds are distant. No murmur heard.    No friction rub. No gallop.  Pulmonary:     Effort: Pulmonary effort is normal. No respiratory distress.     Breath sounds: Normal breath sounds. No wheezing, rhonchi or rales.  Chest:     Chest wall: No tenderness.  Abdominal:     General: Bowel sounds are normal. There is no distension.     Palpations: Abdomen is soft. There is no mass.     Tenderness: There is no abdominal tenderness.     Hernia: No hernia is present.     Comments: Significant trouble obesity.  Difficult to assess HSM.  No bruits  Musculoskeletal:        General: Swelling (Trivial) present. Normal range of motion.     Cervical back: Normal range of motion and neck supple.  Skin:    General: Skin is warm.     Coloration: Skin is not jaundiced or pale.     Comments: Lots of mild scars on lower legs.  Neurological:     General: No focal deficit present.     Mental Status: Dean Farmer is alert and oriented to person, place, and time.     Cranial Nerves: No cranial nerve deficit.     Motor: No weakness.  Psychiatric:        Mood and Affect: Mood normal.        Behavior: Behavior normal.  Thought Content: Thought content normal.        Judgment: Judgment normal.     Comments: Somewhat blunted affect     Adult ECG Report  Rate: 75 ;  Rhythm: normal sinus rhythm, sinus arrhythmia, and 1  AVB ; low voltage borderline negative.  Narrative Interpretation: Borderline EKG  EKG from PCP dated 4/23  Rate: 65;   Rhythm: normal sinus rhythm and 1  AVB ; low voltage.  Otherwise normal axis, intervals durations.  Narrative Interpretation: Borderline EKG.   Recent Labs:  ***  No results found for: "CHOL", "HDL", "LDLCALC", "LDLDIRECT", "TRIG", "CHOLHDL" Lab Results  Component Value Date   CREATININE 0.86 12/09/2017   BUN 12 12/09/2017   NA 136 12/09/2017   K 3.7 12/09/2017   CL 99 (L) 12/09/2017   CO2 23 12/09/2017      Latest Ref Rng & Units 12/09/2017    8:10 PM  CBC  WBC 4.0 - 10.5 K/uL 9.5   Hemoglobin 13.0 - 17.0 g/dL 16.4   Hematocrit 39.0 - 52.0 % 45.8   Platelets 150 - 400 K/uL 162     No results found for: "HGBA1C" No results found for: "TSH"  ==================================================  COVID-19 Education: The signs and symptoms of COVID-19 were discussed with the patient and how to seek care for testing (follow up with PCP or arrange E-visit).    I spent a total of *** minutes with the patient spent in direct patient consultation.  Additional time spent with chart review  / charting (studies, outside notes, etc): *** min Total Time: *** min  Current medicines are reviewed at length with the patient today.  (+/- concerns) ***  This visit occurred during the SARS-CoV-2 public health emergency.  Safety protocols were in place, including screening questions prior to the visit, additional usage of staff PPE, and extensive cleaning of exam room while observing appropriate contact time as indicated for disinfecting solutions.  Notice: This dictation was prepared with Dragon dictation along with smart phrase technology. Any transcriptional errors that result from this process are unintentional and may not be corrected upon review.   Studies Ordered:  No orders of the defined types were placed in this encounter.  No orders of the defined types were placed in this encounter.   Patient Instructions / Medication Changes & Studies & Tests Ordered   There are no Patient  Instructions on file for this visit.    Glenetta Hew, M.D., M.S. Interventional Cardiologist   Pager # [phone] Phone # [phone] [address] [place], [place] [address]   Thank you for choosing Heartcare at [facility]!!

## 2021-12-19 NOTE — Patient Instructions (Addendum)
Medication Instructions:    See instruction below  - Metoprolol tartrate 100 mg   *If you need a refill on your cardiac medications before your next appointment, please call your pharmacy*   Lab Work: See instruction below   If you have labs (blood work) drawn today and your tests are completely normal, you will receive your results only by: [place] (if you have MyChart) OR A paper copy in the mail If you have any lab test that is abnormal or we need to change your treatment, we will call you to review the results.   Testing/Procedures: Will be schedule at [facility] at Radiology Your physician has requested that you have coronary CTA. Cardiac computed tomography (CT) angiogram is a painless test that uses an x-ray machine to take clear, detailed pictures of your heart arteries. Please follow instruction sheet as given.     Follow-Up: At [facility], you and your health needs are our priority.  As part of our continuing mission to provide you with exceptional heart care, we have created designated Provider Care Teams.  These Care Teams include your primary Cardiologist (physician) and Advanced Practice Providers (APPs -  Physician Assistants and Nurse Practitioners) who all work together to provide you with the care you need, when you need it.     Your next appointment:   2 month(s)  The format for your next appointment:   In Person  Provider:   Glenetta Hew, MD    Other Instructions    Your cardiac CT will be scheduled at  the below location:   [facility] [address] [place], [place] [address] [phone]  [place] [address] [place], [place] [address] [phone]  If scheduled at [facility], please arrive at the [facility] and Children's Entrance (Entrance C2) of [facility] 30 minutes prior to test start time. You can use the FREE valet parking offered at  entrance C (encouraged to control the heart rate for the test)  Proceed to the [facility] Radiology Department (first floor) to check-in and test prep.  All radiology patients and guests should use entrance C2 at [facility], accessed from [facility], even though the hospital's physical address listed is [address].      Please follow these instructions carefully (unless otherwise directed):  Hold all erectile dysfunction medications ( Viagra or Cialis)  at least 3 days (72 hrs) prior to test.  BMP today    On the Night Before the Test: Be sure to Drink plenty of water. Do not consume any caffeinated/decaffeinated beverages or chocolate 12 hours prior to your test. Do not take any antihistamines 12 hours prior to your test.   On the Day of the Test: Drink plenty of water until 1 hour prior to the test. Do not eat any food 4 hours prior to the test. You may take your regular medications prior to the test.  Take metoprolol (Lopressor) two hours prior to test.       After the Test: Drink plenty of water. After receiving IV contrast, you may experience a mild flushed feeling. This is normal. On occasion, you may experience a mild rash up to 24 hours after the test. This is not dangerous. If this occurs, you can take Benadryl 25 mg and increase your fluid intake. If you experience trouble breathing, this can be serious. If it is severe call 911 IMMEDIATELY.  If it is mild, please call our office.   We will call to schedule your test 2-4 weeks out understanding that some insurance companies will need an authorization prior to the service being performed.   For non-scheduling related questions, please contact the cardiac imaging nurse navigator should you have any questions/concerns: Marchia Bond, Cardiac Imaging Nurse Navigator Gordy Clement, Cardiac Imaging Nurse Navigator [facility] Direct Office Dial: [phone]    For scheduling needs, including cancellations and rescheduling, please call [place], [phone].

## 2021-12-19 NOTE — Progress Notes (Unsigned)
Primary Care Provider: London Pepper, MD [facility] Cardiologist: None Electrophysiologist: None  Clinic Note: No chief complaint on file.   ===================================  ASSESSMENT/PLAN   Problem List Items Addressed This Visit   None   ===================================  HPI:    Dean Farmer is an obese 51 y.o. male with history of DM-2, HLD, OSA (recently diagnosed-pending CPAP evaluation) who is being seen today for the evaluation of Chest Tightness and Pressure at the request of London Pepper, MD.  Dean Farmer was seen on 10/10/2021 by Dr. Orland Mustard with complaints of flulike symptoms for 3 weeks-cough, runny nose, chest congestion as well as chest heaviness.  There is also fatigue.  Some tingling of the chest discomfort down the left arm. => Started on Z-Pak and referred to cardiology  Recent Hospitalizations: None  Reviewed  CV studies:    The following studies were reviewed today: (if available, images/films reviewed: From Epic Chart or Care Everywhere) None:  Interval History:   Dean Farmer   CV Review of Symptoms (Summary) Cardiovascular ROS: {roscv:310661}  REVIEWED OF SYSTEMS   ROS  I have reviewed and (if needed) personally updated the patient's problem list, medications, allergies, past medical and surgical history, social and family history.   PAST MEDICAL HISTORY   Past Medical History:  Diagnosis Date   Anxiety    Diabetes mellitus without complication (HCC)    GERD (gastroesophageal reflux disease)    Hypercholesteremia    Hyperlipidemia    Low vitamin D level    Sleep apnea     PAST SURGICAL HISTORY   Past Surgical History:  Procedure Laterality Date   CARPAL TUNNEL RELEASE     TENDON REPAIR     WISDOM TOOTH EXTRACTION       There is no immunization history on file for this patient.  MEDICATIONS/ALLERGIES   Current Meds  Medication Sig   atorvastatin (LIPITOR) 10 MG tablet Take 10 mg by mouth daily.    Dulaglutide (TRULICITY) 4.78 GN/5.6OZ SOPN Inject 1.5 mg into the skin.   empagliflozin (JARDIANCE) 25 MG TABS tablet Take 25 mg by mouth daily.   escitalopram (LEXAPRO) 10 MG tablet 20 mg.   indomethacin (INDOCIN) 25 MG capsule Take one pill 30 minutes prior to intercourse to prevent sex headache   omeprazole (PRILOSEC) 40 MG capsule Take 1 capsule (40 mg total) by mouth daily. Take one pill 20-30 minutes before breakfast meal daily   pioglitazone (ACTOS) 45 MG tablet Take 45 mg by mouth daily.   topiramate (TOPAMAX) 100 MG tablet Take 1/2 tablet in the morning and 1 tablet at bedtime for one week, then increase to 1 tablet twice a day   TRESIBA FLEXTOUCH 200 UNIT/ML FlexTouch Pen SMARTSIG:15 Unit(s) SUB-Q Daily    Allergies  Allergen Reactions   Ozempic (0.25 Or 0.5 Mg-Dose) [Semaglutide(0.25 Or 0.'5mg'$ -Dos)] Nausea And Vomiting   Semaglutide Nausea And Vomiting    SOCIAL HISTORY/FAMILY HISTORY   Reviewed in Epic:   Social History   Tobacco Use   Smoking status: Former    Packs/day: 1.00    Types: Cigarettes    Quit date: 12/19/2020    Years since quitting: 1.0   Smokeless tobacco: Former    Types: Nurse, children's Use: Never used  Substance Use Topics   Alcohol use: Not Currently    Comment: Rare alcohol-beer wine and liquor.   Drug use: No   Social History   Social History Narrative  Caffeine- 5- 6 cups a day    Left handed    Lives at home with wife-1 daughter      No structural exercise.  Walks some.      Family History  Problem Relation Age of Onset   Diabetes Mother    Cancer - Other Mother    COPD Mother    Dementia Mother    Diabetes Father    Heart attack Father    Autoimmune disease Daughter    Polycystic ovary syndrome Daughter    Colon polyps Paternal Aunt        x 2   Colon cancer Paternal Uncle    Stomach cancer Paternal Uncle    Diabetes Other    Heart disease Other    Esophageal cancer Neg Hx    Rectal cancer Neg Hx    Sleep  apnea Neg Hx     OBJCTIVE -PE, EKG, labs   Wt Readings from Last 3 Encounters:  12/19/21 284 lb (128.8 kg)  11/22/21 282 lb (127.9 kg)  11/07/21 288 lb 6 oz (130.8 kg)    Physical Exam: BP 122/74   Pulse 75   Ht 6' (1.829 m)   Wt 284 lb (128.8 kg)   SpO2 99%   BMI 38.52 kg/m  Physical Exam   Adult ECG Report  Rate: *** ;  Rhythm: {rhythm:17366};   Narrative Interpretation: ***  Recent Labs:  ***  No results found for: "CHOL", "HDL", "LDLCALC", "LDLDIRECT", "TRIG", "CHOLHDL" Lab Results  Component Value Date   CREATININE 0.86 12/09/2017   BUN 12 12/09/2017   NA 136 12/09/2017   K 3.7 12/09/2017   CL 99 (L) 12/09/2017   CO2 23 12/09/2017      Latest Ref Rng & Units 12/09/2017    8:10 PM  CBC  WBC 4.0 - 10.5 K/uL 9.5   Hemoglobin 13.0 - 17.0 g/dL 16.4   Hematocrit 39.0 - 52.0 % 45.8   Platelets 150 - 400 K/uL 162     No results found for: "HGBA1C" No results found for: "TSH"  ==================================================  COVID-19 Education: The signs and symptoms of COVID-19 were discussed with the patient and how to seek care for testing (follow up with PCP or arrange E-visit).    I spent a total of *** minutes with the patient spent in direct patient consultation.  Additional time spent with chart review  / charting (studies, outside notes, etc): *** min Total Time: *** min  Current medicines are reviewed at length with the patient today.  (+/- concerns) ***  This visit occurred during the SARS-CoV-2 public health emergency.  Safety protocols were in place, including screening questions prior to the visit, additional usage of staff PPE, and extensive cleaning of exam room while observing appropriate contact time as indicated for disinfecting solutions.  Notice: This dictation was prepared with Dragon dictation along with smart phrase technology. Any transcriptional errors that result from this process are unintentional and may not be corrected upon  review.   Studies Ordered:  No orders of the defined types were placed in this encounter.  No orders of the defined types were placed in this encounter.   Patient Instructions / Medication Changes & Studies & Tests Ordered   There are no Patient Instructions on file for this visit.    Glenetta Hew, M.D., M.S. Interventional Cardiologist   Pager # [phone] Phone # [phone] [address]. [place], [place] [address]   Thank you for choosing [facility] at  [facility]!!

## 2021-12-20 ENCOUNTER — Encounter: Payer: Self-pay | Admitting: Cardiology

## 2021-12-20 LAB — BASIC METABOLIC PANEL
BUN/Creatinine Ratio: 17 (ref 9–20)
BUN: 16 mg/dL (ref 6–24)
CO2: 19 mmol/L — ABNORMAL LOW (ref 20–29)
Calcium: 9 mg/dL (ref 8.7–10.2)
Chloride: 108 mmol/L — ABNORMAL HIGH (ref 96–106)
Creatinine, Ser: 0.92 mg/dL (ref 0.76–1.27)
Glucose: 126 mg/dL — ABNORMAL HIGH (ref 70–99)
Potassium: 4.1 mmol/L (ref 3.5–5.2)
Sodium: 141 mmol/L (ref 134–144)
eGFR: 100 mL/min/{1.73_m2} (ref >59)

## 2021-12-20 NOTE — Assessment & Plan Note (Addendum)
He has 2 types of chest pain that mother describes as a heaviness and pressure that is more associated with activity is not as well concerning.  The other symptom Dean Farmer may very well be somewhat written yet.  I think we can evaluate that after we have determine whether he has ischemia.  He himself has significant risk factors not to mention the fact that he has family history of CAD.  Morbidly obese diabetes on multiple medications as well as hyperlipidemia.  He does not have hypertension but is a former smoker.  Plan:  Ischemic evaluation with coronary CTA and possible FFRCT.  For now continue statin along with GLP-1 agonist and SGLT2 inhibitor.  BP controlled therefore not on any type antihypertensive.  Plus/minus aspirin depending on CT scan findings.

## 2021-12-20 NOTE — Assessment & Plan Note (Signed)
Unfortunately, his weight is up as opposed to down.  We did talk some about dietary modification.  He is concerned now about even thinking about exercising because of this chest discomfort.  Pending results of our CTA we can discuss need for exercise as well.

## 2021-12-20 NOTE — Assessment & Plan Note (Signed)
On multiple medications.  All relatively appropriate, however Coronary CTA does indicate significant CAD and there is concern for cardiovascular disease,.  Pioglitazone would not be a good option-would recommend discontinuing

## 2021-12-20 NOTE — Assessment & Plan Note (Signed)
Pretty significant drop in cholesterol levels on very low-dose of statin.  This is reassuring.  Unfortunately he is not really losing any weight with the GLP-1 and SGLT2 inhibitor.  For now his LDL is less than 100 and therefore somewhat at goal, but true target will be dependent on Coronary CTA.  I do not have an A1c on him but he is on multiple medications including insulin. => Appropriately on specialty behavior and GLP-1 agonist for potential concern for cardiovascular risk.  However similarly with concern for cardiovascular risk would probably consider stopping pioglitazone especially if Coronary CTA is abnormal.

## 2022-01-01 ENCOUNTER — Other Ambulatory Visit: Payer: Self-pay | Admitting: Psychiatry

## 2022-01-10 ENCOUNTER — Telehealth (HOSPITAL_COMMUNITY): Payer: Self-pay | Admitting: *Deleted

## 2022-01-10 NOTE — Telephone Encounter (Signed)
Patient returning call regarding upcoming cardiac imaging study; pt verbalizes understanding of appt date/time, parking situation and where to check in, pre-test NPO status and medications ordered, and verified current allergies; name and call back number provided for further questions should they arise  Dean Clement RN Navigator Cardiac Imaging [facility] [phone] office [phone] cell  Patient to take '100mg'$  metoprolol tartrate two hours prior to his cardiac CT scan. He is aware to arrive at 11am.

## 2022-01-10 NOTE — Telephone Encounter (Signed)
Attempted to call patient regarding upcoming cardiac CT appointment. °Left message on voicemail with name and callback number ° °Luanna Weesner RN Navigator Cardiac Imaging °[facility] °[phone] Office °[phone] Cell ° °

## 2022-01-11 ENCOUNTER — Ambulatory Visit (HOSPITAL_COMMUNITY)
Admission: RE | Admit: 2022-01-11 | Discharge: 2022-01-11 | Disposition: A | Discharge status: Home / Self Care | Payer: No Typology Code available for payment source | Source: Ambulatory Visit | Attending: Cardiology | Admitting: Cardiology

## 2022-01-11 DIAGNOSIS — Z794 Long term (current) use of insulin: Secondary | ICD-10-CM | POA: Insufficient documentation

## 2022-01-11 DIAGNOSIS — R072 Precordial pain: Secondary | ICD-10-CM | POA: Insufficient documentation

## 2022-01-11 DIAGNOSIS — E1169 Type 2 diabetes mellitus with other specified complication: Secondary | ICD-10-CM | POA: Diagnosis not present

## 2022-01-11 DIAGNOSIS — E119 Type 2 diabetes mellitus without complications: Secondary | ICD-10-CM | POA: Insufficient documentation

## 2022-01-11 DIAGNOSIS — E785 Hyperlipidemia, unspecified: Secondary | ICD-10-CM | POA: Diagnosis present

## 2022-01-11 HISTORY — PX: CT CTA CORONARY W/CA SCORE W/CM &/OR WO/CM: HXRAD787

## 2022-01-11 MED ORDER — NITROGLYCERIN 0.4 MG SL SUBL
SUBLINGUAL_TABLET | SUBLINGUAL | Status: AC
  Filled 2022-01-11: qty 2

## 2022-01-11 MED ORDER — IOHEXOL 350 MG/ML SOLN
100.0000 mL | Freq: Once | INTRAVENOUS | Status: AC | PRN
  Administered 2022-01-11: 100 mL via INTRAVENOUS

## 2022-01-11 MED ORDER — NITROGLYCERIN 0.4 MG SL SUBL
0.8000 mg | SUBLINGUAL_TABLET | Freq: Once | SUBLINGUAL | Status: AC
  Administered 2022-01-11: 0.8 mg via SUBLINGUAL

## 2022-01-24 ENCOUNTER — Ambulatory Visit: Payer: No Typology Code available for payment source | Admitting: Gastroenterology

## 2022-01-31 ENCOUNTER — Encounter: Payer: Self-pay | Admitting: Cardiology

## 2022-01-31 ENCOUNTER — Ambulatory Visit: Payer: No Typology Code available for payment source | Admitting: Cardiology

## 2022-01-31 ENCOUNTER — Ambulatory Visit (INDEPENDENT_AMBULATORY_CARE_PROVIDER_SITE_OTHER): Payer: No Typology Code available for payment source

## 2022-01-31 VITALS — BP 122/78 | HR 77 | Resp 20 | Ht 72.0 in | Wt 281.4 lb

## 2022-01-31 DIAGNOSIS — R072 Precordial pain: Secondary | ICD-10-CM

## 2022-01-31 DIAGNOSIS — R55 Syncope and collapse: Secondary | ICD-10-CM | POA: Insufficient documentation

## 2022-01-31 DIAGNOSIS — E1169 Type 2 diabetes mellitus with other specified complication: Secondary | ICD-10-CM

## 2022-01-31 DIAGNOSIS — R519 Headache, unspecified: Secondary | ICD-10-CM

## 2022-01-31 DIAGNOSIS — E785 Hyperlipidemia, unspecified: Secondary | ICD-10-CM

## 2022-01-31 DIAGNOSIS — R000 Tachycardia, unspecified: Secondary | ICD-10-CM

## 2022-01-31 NOTE — Progress Notes (Unsigned)
Primary Care Provider: London Pepper, MD [facility] Cardiologist: Glenetta Hew, MD Electrophysiologist: None  Clinic Note: No chief complaint on file.   ===================================  ASSESSMENT/PLAN   Problem List Items Addressed This Visit       Cardiology Problems   Near syncope   Hyperlipidemia associated with type 2 diabetes mellitus ([place]) - Primary (Chronic)     Other   Morbid obesity (HCC) (Chronic)   Frequent headaches   Precordial chest pain   Rapid or irregular heartbeat (Chronic)   ===================================  HPI:    Dean Farmer is an obese 51 y.o. male with history of DM-2, HLD, OSA (recently diagnosed-pending CPAP evaluation) who is being seen today for the evaluation of Chest Tightness and Pressure at the request of London Pepper, MD.  Dean Farmer was seen on 10/10/2021 by Dr. Orland Mustard with complaints of flulike symptoms for 3 weeks-cough, runny nose, chest congestion as well as chest heaviness.  There is also fatigue.  Some tingling of the chest discomfort down the left arm. => Started on Z-Pak and referred to cardiology because of the chest tightness.   I saw EMERSYN KOTARSKI for initial consultation on 12/19/2021 for evaluation stating that he is recovered from his bronchitis but he still having the chest tightness in fact he says this been going on for about 6 months just getting worse.  It used to happen just with more vigorous activity but now is happening with modest activity.  Occurs usually when he is active either at work or trying to do some type of housework or more notably during sex.  He says the episodes lasted maybe a minute although sometimes it can be a little longer.  He is relatively poor historian, intensive go back and forth between different symptoms.  A lot of things that he describes revolve around the fact that he has chronic headaches that really limiting to him.  He definitely gets headaches with more vigorous activity  including sex and he thinks it may be related to his chest pain.  He also mentions that when he was age 63 he had a panic attack and went to the emergency room.  While in the emergency room hooked up to monitors he felt some discomfort in his chest and stated that the whole team came into the room asking QUESTIONS seeing he is doing indicating that something happened on the monitor.  He has no idea what was seen or what was diagnosed but he now has severe eczema yes just discomfort.  Since that time he has had intermittent episodes of the sharp stabbing central chest discomfort symptoms that are usually less than a minute.  The tightness he is describing now is different than that but they can both occur at the same time.  He is being evaluated for sleep apnea so he does have some orthopnea symptoms but more because of his obesity.  He denies any real PND.  Trivial ankle edema.  Only really noticed that in the right foot.  I asked about palpitations and he mentions this sharp discomfort episodes which may be his sensation of palpitations but he denies any rapid irregular heartbeats with associated dizziness.  What he does note is that he gets dizzy and lightheaded with this chest tightness and short of breath.  He also notes that whenever he does anything more than usual vigorous activity he will get lightheaded and dizzy.  While he denies any true syncope, he has had some near  syncopal type episodes, associated with these dizziness spells.  No TIA or amaurosis fugax.  No claudication.  Recent Hospitalizations: None  Reviewed  CV studies:    The following studies were reviewed today: (if available, images/films reviewed: From Epic Chart or Care Everywhere) Coronary CT Angiography 01/11/2022:  Interval History:   Dean Farmer presents here today to discuss tests results.   Still has off & on chest discomfort - like a punch in the chest. Stil feels fluttering sensation  in chest & has intermittent  near syncope Sx - that he describes as feeling "like he is being 'choked out'" - sees stars & feels a rushing sensation - like in [place].    1 episode - passed out while wlaking the dog.    REVIEWED OF SYSTEMS   Review of Systems  Constitutional:  Positive for malaise/fatigue. Negative for weight loss.  HENT:  Negative for congestion and sinus pain.   Eyes:  Negative for blurred vision.  Respiratory:  Positive for shortness of breath (With exertion). Negative for cough (His cough from April has resolved.).   Cardiovascular:        Per HPI  Gastrointestinal:  Negative for blood in stool, constipation and melena.  Genitourinary:  Negative for flank pain, frequency and hematuria.  Musculoskeletal:  Positive for joint pain. Negative for back pain and falls.  Neurological:  Positive for dizziness and headaches (Chronic persistent). Negative for speech change, focal weakness and weakness.  Psychiatric/Behavioral:  Negative for depression (I think he may be over the depression but still has tendencies.) and memory loss. The patient is nervous/anxious. The patient does not have insomnia.     I have reviewed and (if needed) personally updated the patient's problem list, medications, allergies, past medical and surgical history, social and family history.   PAST MEDICAL HISTORY   Past Medical History:  Diagnosis Date   Anxiety    On Celexa   Diabetes mellitus without complication ([place])    On combination of Trulicity, Jardiance, Tresiba (insulin) and Actos.   Former heavy tobacco smoker    Quit in June 2022   GERD (gastroesophageal reflux disease)    Hyperlipidemia    Low testosterone    Low vitamin D level    Sleep apnea    Pending CPAP titration    PAST SURGICAL HISTORY   Past Surgical History:  Procedure Laterality Date   CARPAL TUNNEL RELEASE     TENDON REPAIR     WISDOM TOOTH EXTRACTION       There is no immunization history on file for this  patient.  MEDICATIONS/ALLERGIES   Current Meds  Medication Sig   atorvastatin (LIPITOR) 10 MG tablet Take 10 mg by mouth daily.   Dulaglutide (TRULICITY) 6.38 GY/6.5LD SOPN Inject 1.5 mg into the skin.   empagliflozin (JARDIANCE) 25 MG TABS tablet Take 25 mg by mouth daily.   escitalopram (LEXAPRO) 10 MG tablet 20 mg.   indomethacin (INDOCIN) 25 MG capsule Take one pill 30 minutes prior to intercourse to prevent sex headache   omeprazole (PRILOSEC) 40 MG capsule Take 1 capsule (40 mg total) by mouth daily. Take one pill 20-30 minutes before breakfast meal daily   pioglitazone (ACTOS) 45 MG tablet Take 45 mg by mouth daily.   topiramate (TOPAMAX) 100 MG tablet Take 1/2 tablet in the morning and 1 tablet at bedtime for one week, then increase to 1 tablet twice a day   TRESIBA FLEXTOUCH 200 UNIT/ML FlexTouch Pen SMARTSIG:15  Unit(s) SUB-Q Daily    Allergies  Allergen Reactions   Ozempic (0.25 Or 0.5 Mg-Dose) [Semaglutide(0.25 Or 0.'5mg'$ -Dos)] Nausea And Vomiting    SOCIAL HISTORY/FAMILY HISTORY   Reviewed in Epic:   Social History   Tobacco Use   Smoking status: Former    Packs/day: 1.00    Types: Cigarettes    Quit date: 12/19/2020    Years since quitting: 1.1   Smokeless tobacco: Former    Types: Nurse, children's Use: Never used  Substance Use Topics   Alcohol use: Not Currently    Comment: Rare alcohol-beer wine and liquor.   Drug use: No   Social History   Social History Narrative   Caffeine- 5- 6 cups a day    Left handed    Lives at home with wife-1 daughter      No structural exercise.  Walks some.      I indicated he had a very difficult time during all the death of his mother 4 years ago.  At that time he basically lost control and stopped taking care of himself for quite a while.  He is now trying to "pick up the pieces ".      Family History  Problem Relation Age of Onset   Diabetes Mother    Cancer - Other Mother    COPD Mother    Dementia  Mother    Diabetes Father    Heart attack Father 37   Hyperlipidemia Father    Autoimmune disease Daughter    Polycystic ovary syndrome Daughter    Colon polyps Paternal Aunt        x 2   Colon cancer Paternal Uncle    Stomach cancer Paternal Uncle    Diabetes Other    Heart disease Other    Esophageal cancer Neg Hx    Rectal cancer Neg Hx    Sleep apnea Neg Hx     OBJCTIVE -PE, EKG, labs   Wt Readings from Last 3 Encounters:  01/31/22 281 lb 6.4 oz (127.6 kg)  12/19/21 284 lb (128.8 kg)  11/22/21 282 lb (127.9 kg)    Physical Exam: BP 122/78 (BP Location: Left Arm, Patient Position: Sitting, Cuff Size: Normal)   Pulse 77   Resp 20   Ht 6' (1.829 m)   Wt 281 lb 6.4 oz (127.6 kg)   SpO2 99%   BMI 38.16 kg/m  Physical Exam Vitals reviewed.  Constitutional:      General: He is not in acute distress.    Appearance: He is ill-appearing (Mild chronically ill-appearing). He is not toxic-appearing.     Comments: Morbidly obese with BMI of 38.5 and risk factors of hyperlipidemia and diabetes.  HENT:     Head: Normocephalic and atraumatic.     Comments: Beard Eyes:     Extraocular Movements: Extraocular movements intact.     Conjunctiva/sclera: Conjunctivae normal.     Pupils: Pupils are equal, round, and reactive to light.     Comments: Wears glasses.  Neck:     Vascular: No carotid bruit or JVD.  Cardiovascular:     Rate and Rhythm: Normal rate and regular rhythm. No extrasystoles are present.    Chest Wall: PMI is not displaced.     Pulses: Intact distal pulses. Decreased pulses (Diminished due to body habitus but palpable.).     Heart sounds: S1 normal and S2 normal. Heart sounds are distant. No murmur heard.    No friction  rub. No gallop.  Pulmonary:     Effort: Pulmonary effort is normal. No respiratory distress.     Breath sounds: Normal breath sounds. No wheezing, rhonchi or rales.  Chest:     Chest wall: No tenderness.  Abdominal:     General: Bowel sounds  are normal. There is no distension.     Palpations: Abdomen is soft. There is no mass.     Tenderness: There is no abdominal tenderness.     Hernia: No hernia is present.     Comments: Significant trouble obesity.  Difficult to assess HSM.  No bruits  Musculoskeletal:        General: Swelling (Trivial) present. Normal range of motion.     Cervical back: Normal range of motion and neck supple.  Skin:    General: Skin is warm.     Coloration: Skin is not jaundiced or pale.     Comments: Lots of mild scars on lower legs.  Neurological:     General: No focal deficit present.     Mental Status: He is alert and oriented to person, place, and time.     Cranial Nerves: No cranial nerve deficit.     Motor: No weakness.  Psychiatric:        Mood and Affect: Mood normal.        Behavior: Behavior normal.        Thought Content: Thought content normal.        Judgment: Judgment normal.     Comments: Somewhat blunted affect     Adult ECG Report N/a  EKG from PCP dated 4/23  Rate: 65;  Rhythm: normal sinus rhythm and 1  AVB ; low voltage.  Otherwise normal axis, intervals durations.  Narrative Interpretation: Borderline EKG.  Recent Labs: 05/10/2021 Na+ 140, K+ 4.5, Cl- 108, HCO3-27, BUN 23, Cr 0.85, Glu 222, Ca2+ 8.7; AST 13, ALT 19, AlkP 110 CBC: W 4.1, H/H 14.0/41.1, Plt 148;  TC 166, TG 171, HDL 42, LDL 95 (on 10 mg atorvastatin) 10/04/2020: TC 241, TG 238, HDL 42, LDL 155   Last A1 c 6.2 ==================================================  COVID-19 Education: The signs and symptoms of COVID-19 were discussed with the patient and how to seek care for testing (follow up with PCP or arrange E-visit).    I spent a total of 40 minutes with the patient spent in direct patient consultation.  Additional time spent with chart review  / charting (studies, outside notes, etc): 24 min Total Time: 64 min  Current medicines are reviewed at length with the patient today.  (+/- concerns)  n/a  This visit occurred during the SARS-CoV-2 public health emergency.  Safety protocols were in place, including screening questions prior to the visit, additional usage of staff PPE, and extensive cleaning of exam room while observing appropriate contact time as indicated for disinfecting solutions.  Notice: This dictation was prepared with Dragon dictation along with smart phrase technology. Any transcriptional errors that result from this process are unintentional and may not be corrected upon review.   Studies Ordered:  No orders of the defined types were placed in this encounter.  No orders of the defined types were placed in this encounter.   Patient Instructions / Medication Changes & Studies & Tests Ordered   There are no Patient Instructions on file for this visit.    Glenetta Hew, M.D., M.S. Interventional Cardiologist   Pager # [phone] Phone # [phone] [address]. [place], [place] [address]  Thank you for choosing [facility] at [facility]!!

## 2022-01-31 NOTE — Progress Notes (Unsigned)
Enrolled for [employer] to mail a ZIO AT Live Telemetry monitor to patients address on file.  [place], [place], [place]  [id number].

## 2022-01-31 NOTE — Patient Instructions (Addendum)
Medication Instructions:  No changes  *If you need a refill on your cardiac medications before your next appointment, please call your pharmacy*   Lab Work: Not needed   Testing/Procedures:  Your physician has requested that you have an echocardiogram. Echocardiography is a painless test that uses sound waves to create images of your heart. It provides your doctor with information about the size and shape of your heart and how well your heart's chambers and valves are working. This procedure takes approximately one hour. There are no restrictions for this procedure.    Your physician has recommended that you wear a holter monitor 7 days Zio . Holter monitors are medical devices that record the heart's electrical activity. Doctors most often use these monitors to diagnose arrhythmias. Arrhythmias are problems with the speed or rhythm of the heartbeat. The monitor is a small, portable device. You can wear one while you do your normal daily activities. This is usually used to diagnose what is causing palpitations/syncope (passing out).   Follow-Up: At [facility], you and your health needs are our priority.  As part of our continuing mission to provide you with exceptional heart care, we have created designated Provider Care Teams.  These Care Teams include your primary Cardiologist (physician) and Advanced Practice Providers (APPs -  Physician Assistants and Nurse Practitioners) who all work together to provide you with the care you need, when you need it.     Your next appointment:   8 week(s)  The format for your next appointment:   In Person  Provider:   Glenetta Hew, MD    Other Instructions   ZIO XT- Long Term Monitor Instructions  Your physician has requested you wear a ZIO patch monitor for 7 days.  This is a single patch monitor. [employer] supplies one patch monitor per enrollment. Additional stickers are not available. Please do not apply patch if you will be having a  Nuclear Stress Test,  Echocardiogram, Cardiac CT, MRI, or Chest Xray during the period you would be wearing the  monitor. The patch cannot be worn during these tests. You cannot remove and re-apply the  ZIO XT patch monitor.  Your ZIO patch monitor will be mailed 3 day USPS to your address on file. It may take 3-5 days  to receive your monitor after you have been enrolled.  Once you have received your monitor, please review the enclosed instructions. Your monitor  has already been registered assigning a specific monitor serial # to you.  Billing and Patient Assistance Program Information  We have supplied [employer] with any of your insurance information on file for billing purposes. [employer] offers a sliding scale Patient Assistance Program for patients that do not have  insurance, or whose insurance does not completely cover the cost of the ZIO monitor.  You must apply for the Patient Assistance Program to qualify for this discounted rate.  To apply, please call [employer] at [phone], select option 4, select option 2, ask to apply for  Patient Assistance Program. Theodore Demark will ask your household income, and how many people  are in your household. They will quote your out-of-pocket cost based on that information.  [employer] will also be able to set up a 28-month interest-free payment plan if needed.  Applying the monitor   Shave hair from upper left chest.  Hold abrader disc by orange tab. Rub abrader in 40 strokes over the upper left chest as  indicated in your monitor instructions.  Clean area with 4  enclosed alcohol pads. Let dry.  Apply patch as indicated in monitor instructions. Patch will be placed under collarbone on left  side of chest with arrow pointing upward.  Rub patch adhesive wings for 2 minutes. Remove white label marked "1". Remove the white  label marked "2". Rub patch adhesive wings for 2 additional minutes.  While looking in a mirror, press and release button in center  of patch. A small green light will  flash 3-4 times. This will be your only indicator that the monitor has been turned on.  Do not shower for the first 24 hours. You may shower after the first 24 hours.  Press the button if you feel a symptom. You will hear a small click. Record Date, Time and  Symptom in the Patient Logbook.  When you are ready to remove the patch, follow instructions on the last 2 pages of Patient  Logbook. Stick patch monitor onto the last page of Patient Logbook.  Place Patient Logbook in the blue and white box. Use locking tab on box and tape box closed  securely. The blue and white box has prepaid postage on it. Please place it in the mailbox as  soon as possible. Your physician should have your test results approximately 7 days after the  monitor has been mailed back to [facility].  Call [place] at [phone] if you have questions regarding  your ZIO XT patch monitor. Call them immediately if you see an orange light blinking on your  monitor.  If your monitor falls off in less than 4 days, contact our Monitor department at [phone].  If your monitor becomes loose or falls off after 4 days call [employer] at [phone] for  suggestions on securing your monitor

## 2022-02-01 ENCOUNTER — Encounter: Payer: Self-pay | Admitting: Cardiology

## 2022-02-01 NOTE — Assessment & Plan Note (Signed)
Dramatic improvement after starting atorvastatin low-dose.  For now continue current dose.  Coronary CTA was relatively reassuring.  We will treat for LDL less than 100 but with his other risk factors I think getting down closer to 70 would be reasonable.  We discussed the presence of obesity, hypertension hyperlipidemia and diabetes puts him in the category of metabolic syndrome.  Recent A1c was 6.2.  He is on [place] which provides both diabetes and cardiac benefit.  He is on Actos which is somewhat concerning.  With dyspnea and possible even potential CHF symptoms, may want stopping Actos

## 2022-02-01 NOTE — Assessment & Plan Note (Signed)
We talked about evaluating this palpitations but I wanted to get the Coronary CTA done first  We will now plan Zio patch monitor.

## 2022-02-01 NOTE — Assessment & Plan Note (Signed)
He had described having some other episodes of feeling like he was getting really lightheaded and woozy but has not passed out.  I am little more concerned about the episode walking the dog or the security officer checked in on him.  He did not really sure what happened there.  He does not think he lost consciousness but he clearly was not fully coherent.  There was associated flushing sensation and may be heart rate issues.  Plan:  Check 2D echocardiogram and Zio patch monitor.  Looking for structural normalities and arrhythmias.  Brain MRI/MRI checked recently showed normal carotids and vertebrals.

## 2022-02-01 NOTE — Assessment & Plan Note (Signed)
He is still having chest discomfort which is interesting, I heartburn understand what it would be but likely not cardiac based on his Coronary CTA findings.  Possible that is microvascular, but would also consider nonischemic etiology

## 2022-02-01 NOTE — Assessment & Plan Note (Signed)
Unusual headaches that are often associated with this flushed sensations and tachycardia spells as well as any dizziness.  Our goal is to exclude an abnormal cardiac etiology, otherwise with probably asked that he discuss this with neurology when he discusses sleep study.

## 2022-02-03 ENCOUNTER — Ambulatory Visit (HOSPITAL_COMMUNITY): Payer: No Typology Code available for payment source | Attending: Cardiology

## 2022-02-03 DIAGNOSIS — R000 Tachycardia, unspecified: Secondary | ICD-10-CM

## 2022-02-03 DIAGNOSIS — R072 Precordial pain: Secondary | ICD-10-CM

## 2022-02-03 DIAGNOSIS — E1169 Type 2 diabetes mellitus with other specified complication: Secondary | ICD-10-CM

## 2022-02-03 DIAGNOSIS — R55 Syncope and collapse: Secondary | ICD-10-CM | POA: Diagnosis not present

## 2022-02-03 DIAGNOSIS — R519 Headache, unspecified: Secondary | ICD-10-CM

## 2022-02-03 DIAGNOSIS — E785 Hyperlipidemia, unspecified: Secondary | ICD-10-CM | POA: Diagnosis present

## 2022-02-03 LAB — ECHOCARDIOGRAM COMPLETE
Area-P 1/2: 4.39 cm2
S' Lateral: 2.9 cm

## 2022-02-04 DIAGNOSIS — E1169 Type 2 diabetes mellitus with other specified complication: Secondary | ICD-10-CM | POA: Diagnosis not present

## 2022-02-04 DIAGNOSIS — R000 Tachycardia, unspecified: Secondary | ICD-10-CM

## 2022-02-04 DIAGNOSIS — R519 Headache, unspecified: Secondary | ICD-10-CM

## 2022-02-04 DIAGNOSIS — R072 Precordial pain: Secondary | ICD-10-CM

## 2022-02-04 DIAGNOSIS — E785 Hyperlipidemia, unspecified: Secondary | ICD-10-CM

## 2022-02-04 DIAGNOSIS — R55 Syncope and collapse: Secondary | ICD-10-CM

## 2022-02-22 ENCOUNTER — Ambulatory Visit: Payer: No Typology Code available for payment source | Admitting: Neurology

## 2022-02-22 ENCOUNTER — Encounter: Payer: Self-pay | Admitting: Neurology

## 2022-02-22 VITALS — BP 119/74 | HR 60 | Ht 72.0 in | Wt 278.8 lb

## 2022-02-22 DIAGNOSIS — Z9989 Dependence on other enabling machines and devices: Secondary | ICD-10-CM | POA: Diagnosis not present

## 2022-02-22 DIAGNOSIS — G4733 Obstructive sleep apnea (adult) (pediatric): Secondary | ICD-10-CM

## 2022-02-22 NOTE — Progress Notes (Signed)
Subjective:    Farmer ID: Dean Farmer is a 51 y.o. male.  HPI    Interim history:   Dean Farmer is a 51 year old right-handed Dean Farmer with an underlying medical history of diabetes, reflux disease, recurrent headaches, low vitamin D, hyperlipidemia, anxiety and obesity, who presents for follow-up consultation of his obstructive sleep apnea after interim testing and starting AutoPap therapy.  Dean Farmer is unaccompanied today.  I first met him at Dean request of Dr. Billey Gosling on 10/11/2021, at which time Dean Farmer reported a prior diagnosis of obstructive sleep apnea.  Dean Farmer no longer had a CPAP machine.  Dean Farmer was advised to proceed with sleep testing.  Dean Farmer had a home sleep test on 12/05/2021 which indicated moderate obstructive sleep apnea with a total AHI of 21.1/hour and O2 nadir of 85%.  Mild to moderate snoring was detected.  Dean Farmer was advised to proceed with AutoPap therapy.  His set up date was 01/06/2022.  Dean Farmer has a ResMed air sense 11 AutoSet machine.  Today, 02/22/2022: I reviewed his AutoPap compliance data from 01/22/2022 through 02/20/2022, which is a total of 30 days, during which time Dean Farmer used his machine 26 days with percent use days greater than 4 hours at 80%, indicating very good compliance with an average usage of 6 hours and 10 minutes, residual AHI at goal at 1.2/h, average pressure for Dean 95th percentile at 11.2 cm with a range of 6 to 13 cm.  Leak acceptable with Dean 95th percentile at 12.3 L/min.  Dean Farmer reports doing well, feels, better with AutoPap therapy, is still adjusting to treatment but overall is doing well.  Dean Farmer feels less tired, headaches are less intense and overall better but still has them.  Dean Farmer does have a follow-up appointment coming up with Dr. Billey Gosling next month.  Dean Farmer is in Dean process of moving and settling into a new apartment.  Sadly, they recently lost their puppy.  Dean Farmer is working on weight loss.  Dean Farmer has had instances of falling asleep without putting Dean mask on but other than that Dean Farmer is  consistent with it and very motivated to continue with treatment.  Dean Farmer is using a fullface mask.  Dean Farmer does have a little bit of nose dryness but reports that Dean Farmer has not yet started using Dean humidifier in Dean AutoPap machine, has not really had a chance to get distilled water yet.  His work schedule has changed to Monday through Thursday, 7 AM to 5 PM daily.  Dean Farmer tries to get his doctors appointments done on Fridays for that reason.  Dean Farmer's allergies, current medications, family history, past medical history, past social history, past surgical history and problem list were reviewed and updated as appropriate.   Previously:   10/11/21: (Dean Farmer) was previously diagnosed with obstructive sleep apnea.  Dean Farmer was on positive airway pressure treatment but no longer has a PAP machine.  I reviewed your office note from 05/23/2021.  His Epworth sleepiness score is 12/24, fatigue severity score is 47 out of 63.  His original sleep apnea diagnosis was made in Dean late 90s, Dean Farmer reports that Dean Farmer had severe sleep apnea at Dean time.  His CPAP pressure was around 14 as Dean Farmer recalls.  Dean Farmer had another sleep study may be in 2005 or thereabouts and as Dean Farmer recalls his CPAP pressure was around 11 at Dean time.  Dean Farmer has had weight fluctuation, his heaviest was 360 pounds, his lowest was 245 pounds.  Dean Farmer is working on weight loss.  Dean Farmer  quit smoking about a year ago.  Working on caffeine reduction, Dean Farmer does drink diet [facility], about 3 cans/day on average and some coffee in Dean morning, no alcohol currently.  Dean Farmer lives with his wife and is 49 year old nephew, Dean Farmer has 1 daughter who is 108 and is currently [employer].  They have 2 dogs and 1 cat in Dean household, Dean pets typically sleep in Dean bedroom and on Dean bed with him.  Dean Farmer does have a TV on at night but turns it off before falling asleep.  Bedtime is generally between 10 and 11 and rise time around 6.  Has nocturia about once per average night and has recurrent morning headaches, nearly  daily for Dean past year or so.  Dean Farmer works a 2-2-3 day schedule, works from 7 AM to 7 PM, Dean Farmer works in a [employer].   No prior sleep study results are available for my review today.  Has not used CPAP in Dean past for 5 years, his machine broke and Dean Farmer also had some lapses in insurance coverage.    His Past Medical History Is Significant For: Past Medical History:  Diagnosis Date   Anxiety    On Celexa   Diabetes mellitus without complication ([place])    On combination of Trulicity, Jardiance, Tresiba (insulin) and Actos.   Former heavy tobacco smoker    Quit in June 2022   GERD (gastroesophageal reflux disease)    Hyperlipidemia    Low testosterone    Low vitamin D level    Sleep apnea    Pending CPAP titration    His Past Surgical History Is Significant For: Past Surgical History:  Procedure Laterality Date   CARPAL TUNNEL RELEASE     CT CTA CORONARY W/CA SCORE W/CM &/OR WO/CM  01/11/2022   Coronary Calcium Score 0.  Normal coronary origin with left dominance.  Diffuse minimal plaque less than 25%.  Normal coronary artery size.   TENDON REPAIR     WISDOM TOOTH EXTRACTION      His Family History Is Significant For: Family History  Problem Relation Age of Onset   Diabetes Mother    Cancer - Other Mother    COPD Mother    Dementia Mother    Diabetes Father    Heart attack Father 50   Hyperlipidemia Father    Autoimmune disease Daughter    Polycystic ovary syndrome Daughter    Colon polyps Paternal Aunt        x 2   Colon cancer Paternal Uncle    Stomach cancer Paternal Uncle    Diabetes Other    Heart disease Other    Esophageal cancer Neg Hx    Rectal cancer Neg Hx    Sleep apnea Neg Hx     His Social History Is Significant For: Social History   Socioeconomic History   Marital status: Married    Spouse name: Not on file   Number of children: 1   Years of education: Not on file   Highest education level: GED or equivalent  Occupational History   Occupation:  Glass blower/designer    Comment: [employer]  Tobacco Use   Smoking status: Former    Packs/day: 1.00    Types: Cigarettes    Quit date: 12/19/2020    Years since quitting: 1.1   Smokeless tobacco: Former    Types: Nurse, children's Use: Never used  Substance and Sexual Activity   Alcohol use: Not Currently  Comment: Rare alcohol-beer wine and liquor.   Drug use: No   Sexual activity: Not on file  Other Topics Concern   Not on file  Social History Narrative   Caffeine- 5- 6 cups a day    Left handed    Lives at home with wife-1 daughter      No structural exercise.  Walks some.      I indicated Dean Farmer had a very difficult time during all Dean death of his mother 4 years ago.  At that time Dean Farmer basically lost control and stopped taking care of himself for quite a while.  Dean Farmer is now trying to "pick up Dean pieces ".      Social Determinants of Health   Financial Resource Strain: Not on file  Food Insecurity: Not on file  Transportation Needs: Not on file  Physical Activity: Not on file  Stress: Not on file  Social Connections: Not on file    His Allergies Are:  Allergies  Allergen Reactions   Ozempic (0.25 Or 0.5 Mg-Dose) [Semaglutide(0.25 Or 0.88m-Dos)] Nausea And Vomiting  :   His Current Medications Are:  Outpatient Encounter Medications as of 02/22/2022  Medication Sig   atorvastatin (LIPITOR) 10 MG tablet Take 10 mg by mouth daily.   Dulaglutide (TRULICITY) 09.62MIW/9.7LGSOPN Inject 1.5 mg into Dean skin.   empagliflozin (JARDIANCE) 25 MG TABS tablet Take 25 mg by mouth daily.   escitalopram (LEXAPRO) 10 MG tablet 20 mg. 20 mg daily   indomethacin (INDOCIN) 25 MG capsule Take one pill 30 minutes prior to intercourse to prevent sex headache   omeprazole (PRILOSEC) 40 MG capsule Take 1 capsule (40 mg total) by mouth daily. Take one pill 20-30 minutes before breakfast meal daily   pioglitazone (ACTOS) 45 MG tablet Take 45 mg by mouth daily.   topiramate (TOPAMAX) 100 MG  tablet THEN INCREASE TO 1 TABLET TWICE A DAY   TRESIBA FLEXTOUCH 200 UNIT/ML FlexTouch Pen SMARTSIG:15 Unit(s) SUB-Q Daily   No facility-administered encounter medications on file as of 02/22/2022.  :  Review of Systems:  Out of a complete 14 point review of systems, all are reviewed and negative with Dean exception of these symptoms as listed below:  Review of Systems  Neurological:        Dean Farmer here for CPAP f/u Dean Farmer states ni questions or concerns for todays visit Dean Farmer states Dean Farmer is still having some headaches,fatigue,   ESS:5    Objective:  Neurological Exam  Physical Exam Physical Examination:   Vitals:   02/22/22 0729  BP: 119/74  Pulse: 60    General Examination: Dean Farmer is a very pleasant 51y.o. male in no acute distress. Dean Farmer appears well-developed and well-nourished and well groomed.   HEENT: Normocephalic, atraumatic, pupils are equal, round and reactive to light, extraocular tracking is good without limitation to gaze excursion or nystagmus noted.  Corrective eyeglasses in place.  Hearing is grossly intact. Face is symmetric with normal facial animation. Speech is clear with no dysarthria noted. There is no hypophonia. There is no lip, neck/head, jaw or voice tremor. Neck is supple with full range of passive and active motion. There are no carotid bruits on auscultation. Oropharynx exam reveals: mild mouth dryness, adequate dental hygiene, missing right upper front tooth, moderate airway crowding.  Tongue protrudes centrally and palate elevates symmetrically.     Chest: Clear to auscultation without wheezing, rhonchi or crackles noted.   Heart: S1+S2+0, regular and normal without murmurs, rubs  or gallops noted.    Abdomen: Soft, non-tender and non-distended.   Extremities: There is trace puffiness around both ankles.     Skin: Warm and dry without trophic changes noted.    Musculoskeletal: exam reveals no obvious joint deformities.    Neurologically:  Mental status:  Dean Farmer is awake, alert and oriented in all 4 spheres. His immediate and remote memory, attention, language skills and fund of knowledge are appropriate. There is no evidence of aphasia, agnosia, apraxia or anomia. Speech is clear with normal prosody and enunciation. Thought process is linear. Mood is normal and affect is normal.  Cranial nerves II - XII are as described above under HEENT exam.  Motor exam: Normal bulk, strength and tone is noted. There is no obvious tremor. Fine motor skills and coordination: grossly intact.  Cerebellar testing: No dysmetria or intention tremor. There is no truncal or gait ataxia.  Sensory exam: intact to light touch in Dean upper and lower extremities.  Gait, station and balance: Dean Farmer stands easily. No veering to one side is noted. No leaning to one side is noted. Posture is age-appropriate and stance is narrow based. Gait shows normal stride length and normal pace. No problems turning are noted.    Assessment and Plan:    In summary, Dean Farmer is a very pleasant 51 year old male with an underlying medical history of diabetes, reflux disease, recurrent headaches, low vitamin D, hyperlipidemia, anxiety and obesity, who presents for follow-up consultation of his obstructive sleep apnea which was deemed in Dean moderate range by home sleep testing on 12/05/2021.  Overall AHI was 21.1/h, O2 nadir 85% with mild to moderate snoring detected.  Dean Farmer has established treatment with AutoPap therapy since 01/06/2022 and is generally speaking very compliant with treatment.  Dean Farmer is also benefiting from treatment, reports improvement in his tiredness, sleepiness, and headaches, but still has headaches.  Dean Farmer is encouraged to keep his appointment with Dr. Billey Gosling for next month, Dean Farmer can follow-up in sleep clinic to see one of our nurse practitioners routinely in 1 year.  Dean Farmer is also scheduled for follow-up with his cardiologist.  Dean Farmer recently had a 1 week heart monitor.  Dean Farmer is commended for his  treatment adherence and encouraged to be fully compliant with his AutoPap therapy.  Dean Farmer is encouraged to try to achieve 7 to 8 hours of sleep.  Dean Farmer is advised to continue to work on weight loss.  If Dean Farmer achieves a significant amount of weight loss in Dean future, we can always reassess his sleep apnea with another home sleep test down Dean road.  Dean Farmer does report a diagnosis of severe sleep apnea in Dean past and Dean Farmer used to be much heavier at Dean time.  We reviewed his home sleep test results in detail as well with his compliance data today.  I answered all of his questions today and Dean Farmer was in agreement with our plan.  I spent 30 minutes in total face-to-face time and in reviewing records during pre-charting, more than 50% of which was spent in counseling and coordination of care, reviewing test results, reviewing medications and treatment regimen and/or in discussing or reviewing Dean diagnosis of OSA, Dean prognosis and treatment options. Pertinent laboratory and imaging test results that were available during this visit with Dean Farmer were reviewed by me and considered in my medical decision making (see chart for details).

## 2022-02-22 NOTE — Patient Instructions (Addendum)
It was nice to see you again today. I am glad to hear, things are going well with your autoPAP therapy. You have adjusted well to treatment with your new machine, and you are compliant with it. You have also fulfilled the insurance-mandated compliance percentage, which is reassuring, so you can get ongoing supplies through your insurance. Please talk to your DME provider about getting replacement supplies on a regular basis. Please be sure to change your filter every month, your mask about every 3 months, hose about every 6 months, humidifier chamber about yearly. Some restrictions are imposed by your insurance carrier with regard to how frequently you can get certain supplies.  Your DME company can provide further details if necessary.   Please continue using your autoPAP regularly. While your insurance requires that you use PAP at least 4 hours each night on 70% of the nights, I recommend, that you not skip any nights and use it throughout the night if you can. Getting used to PAP and staying with the treatment long term does take time and patience and discipline. Untreated obstructive sleep apnea when it is moderate to severe can have an adverse impact on cardiovascular health and raise her risk for heart disease, arrhythmias, hypertension, congestive heart failure, stroke and diabetes. Untreated obstructive sleep apnea causes sleep disruption, nonrestorative sleep, and sleep deprivation. This can have an impact on your day to day functioning and cause daytime sleepiness and impairment of cognitive function, memory loss, mood disturbance, and problems focussing. Using PAP regularly can improve these symptoms.  We can see you in 1 year, you can see one of our nurse practitioners as you are stable.   Please continue to see Dr. Billey Gosling for your headaches, you may have to make an appointment with her.

## 2022-03-16 ENCOUNTER — Encounter: Payer: Self-pay | Admitting: Neurology

## 2022-03-24 ENCOUNTER — Ambulatory Visit: Payer: No Typology Code available for payment source | Admitting: Gastroenterology

## 2022-03-28 ENCOUNTER — Ambulatory Visit: Payer: No Typology Code available for payment source | Admitting: Psychiatry

## 2022-03-28 ENCOUNTER — Encounter: Payer: Self-pay | Admitting: Psychiatry

## 2022-03-28 VITALS — BP 114/71 | HR 86 | Ht 72.0 in | Wt 279.2 lb

## 2022-03-28 DIAGNOSIS — M5412 Radiculopathy, cervical region: Secondary | ICD-10-CM | POA: Diagnosis not present

## 2022-03-28 DIAGNOSIS — G43009 Migraine without aura, not intractable, without status migrainosus: Secondary | ICD-10-CM | POA: Diagnosis not present

## 2022-03-28 DIAGNOSIS — G4482 Headache associated with sexual activity: Secondary | ICD-10-CM

## 2022-03-28 MED ORDER — METHOCARBAMOL 750 MG PO TABS: 750.0000 mg | ORAL_TABLET | Freq: Every evening | ORAL | 6 refills | Status: AC | PRN

## 2022-03-28 NOTE — Progress Notes (Signed)
   CC:  headaches  Follow-up Visit  Last visit: 11/07/21  Brief HPI: 51 year old male with a history of DM, HLD, OSA on AutoPap who follows in clinic for chronic migraines following COVID infection. He also reports thunderclap headaches associated with sexual activity. MRI/MRA 06/20/21 were unremarkable.  At his last visit, Topamax was increased to 100 mg BID. Indomethacin was started as needed for primary sex headache.  Interval History: He continues to have daily headaches, but they are less severe on the Topamax 100 mg BID. He is tolerating this well without side effects. Has a lot of neck pain and tension. Can't lift his arms above his head due to pain and weakness. He does have a history of neck trauma which precipitated his symptoms. He has never had neck imaging.  Indomethacin has not been helpful for sex headaches, but does help with his other headaches.  Sleep study showed moderate OSA and he was started on AutoPap. This has helped with his sleep and headaches  Headache days per month: 30 Headache free days per month: 0  Current Headache Regimen: Preventative: Topamax 100 mg BID Abortive: Indomethacin 25 mg PRN   Prior Therapies                                  Topamax 100 mg BID Lexapro 20 mg daily Cymbalta 30 mg BID Indomethacin 25 mg PRN  Physical Exam:   Vital Signs: BP 114/71   Pulse 86   Ht 6' (1.829 m)   Wt 279 lb 3.2 oz (126.6 kg)   BMI 37.87 kg/m  GENERAL:  well appearing, in no acute distress, alert  SKIN:  Color, texture, turgor normal. No rashes or lesions HEAD:  Normocephalic/atraumatic. RESP: normal respiratory effort MSK:  No gross joint deformities.   NEUROLOGICAL: Mental Status: Alert, oriented to person, place and time, Follows commands, and Speech fluent and appropriate. Cranial Nerves: PERRL, face symmetric, no dysarthria, hearing grossly intact Motor: limited range of motion with arm abduction, 4+/5 strength arm abduction  bilaterally Gait: normal-based.  IMPRESSION: 51 year old male with a history of DM, HLD, OSA on AutoPap who presents for follow up of chronic migraines and primary sex headache. His headache severity has improved with Topamax but he does continue to have daily headaches. However he would not like to change his preventive regimen at this time. Indomethacin works well for migraine rescue but has not helped his sex headaches. Offered PRN triptan, which he declined at this time. Will order MRI C-spine for upper extremity pain and weakness following a neck trauma. PRN Robaxin prescribed for muscle spasms. Offered referral to neck PT which he declined at this time.  PLAN: -MRI C-spine -Prevention: Continue Topamax 100 mg BID -Rescue: Continue Indomethacin 25 mg PRN -Next steps: Consider PRN triptan, propranolol  Follow-up: 6 months  I spent a total of 28 minutes on the date of the service. Discussed treatment options including preventive and acute medications. Discussed medication side effects, adverse reactions and drug interactions. Written educational materials and patient instructions outlining all of the above were given.  Genia Harold, MD 03/28/22 1:54 PM

## 2022-03-29 ENCOUNTER — Telehealth: Payer: Self-pay | Admitting: Psychiatry

## 2022-03-29 NOTE — Telephone Encounter (Signed)
[employer] sent to GI they obtain auth 

## 2022-04-14 ENCOUNTER — Other Ambulatory Visit: Payer: No Typology Code available for payment source

## 2022-04-14 ENCOUNTER — Encounter: Payer: Self-pay | Admitting: Gastroenterology

## 2022-04-14 ENCOUNTER — Ambulatory Visit: Payer: No Typology Code available for payment source | Admitting: Gastroenterology

## 2022-04-14 VITALS — BP 118/72 | HR 75 | Ht 72.0 in | Wt 278.6 lb

## 2022-04-14 DIAGNOSIS — A048 Other specified bacterial intestinal infections: Secondary | ICD-10-CM

## 2022-04-14 NOTE — Progress Notes (Signed)
04/14/2022 Dean Farmer [id number] Sep 02, 1970   HISTORY OF PRESENT ILLNESS: This is a 51 year old male who is a patient of Dr. [name].  He is here today to follow-up after his EGD and treatment of his H. pylori infection.  EGD was performed in May.  He completed H. pylori treatment, 10 days worth in June.  Says that he feels well.  He has actually been off of his omeprazole for the past week or so as he has had issues with getting it filled.  EGD 10/2021:  - Moderate, non-specific gastritis. Biopsied to check for H. pylori. - The examination was otherwise normal.  2. Surgical [P], gastric biopsies MARKED CHRONIC, FOCALLY ACTIVE GASTRITIS WITH LYMPHOID AGGREGATES HELICOBACTER PRESENT NEGATIVE FOR INTESTINAL METAPLASIA, DYSPLASIA AND CARCINOMA  Past Medical History:  Diagnosis Date   Anxiety    On Celexa   Diabetes mellitus without complication ([place])    On combination of Trulicity, Jardiance, Tresiba (insulin) and Actos.   Former heavy tobacco smoker    Quit in June 2022   GERD (gastroesophageal reflux disease)    Hyperlipidemia    Low testosterone    Low vitamin D level    Sleep apnea    Pending CPAP titration   Past Surgical History:  Procedure Laterality Date   CARPAL TUNNEL RELEASE     CT CTA CORONARY W/CA SCORE W/CM &/OR WO/CM  01/11/2022   Coronary Calcium Score 0.  Normal coronary origin with left dominance.  Diffuse minimal plaque less than 25%.  Normal coronary artery size.   TENDON REPAIR     WISDOM TOOTH EXTRACTION      reports that he quit smoking about 15 months ago. His smoking use included cigarettes. He smoked an average of 1 pack per day. He has quit using smokeless tobacco.  His smokeless tobacco use included chew. He reports that he does not currently use alcohol. He reports that he does not use drugs. family history includes Autoimmune disease in his daughter; COPD in his mother; Cancer - Other in his mother; Colon cancer in his paternal uncle; Colon  polyps in his paternal aunt; Dementia in his mother; Diabetes in his father, mother, and another family member; Heart attack (age of onset: 10) in his father; Heart disease in an other family member; Hyperlipidemia in his father; Polycystic ovary syndrome in his daughter; Stomach cancer in his paternal uncle. Allergies  Allergen Reactions   Ozempic (0.25 Or 0.5 Mg-Dose) [Semaglutide(0.25 Or 0.'5mg'$ -Dos)] Nausea And Vomiting      Outpatient Encounter Medications as of 04/14/2022  Medication Sig   atorvastatin (LIPITOR) 10 MG tablet Take 10 mg by mouth daily.   empagliflozin (JARDIANCE) 25 MG TABS tablet Take 25 mg by mouth daily.   escitalopram (LEXAPRO) 20 MG tablet Take 20 mg by mouth daily.   indomethacin (INDOCIN) 25 MG capsule Take one pill 30 minutes prior to intercourse to prevent sex headache   methocarbamol (ROBAXIN-750) 750 MG tablet Take 1 tablet (750 mg total) by mouth at bedtime as needed for muscle spasms.   pioglitazone (ACTOS) 45 MG tablet Take 45 mg by mouth daily.   topiramate (TOPAMAX) 100 MG tablet THEN INCREASE TO 1 TABLET TWICE A DAY   TRESIBA FLEXTOUCH 200 UNIT/ML FlexTouch Pen SMARTSIG:15 Unit(s) SUB-Q Daily   TRULICITY 3 WE/3.1VQ SOPN SMARTSIG:3 Milligram(s) SUB-Q Every 4 Weeks   omeprazole (PRILOSEC) 40 MG capsule Take 1 capsule (40 mg total) by mouth daily. Take one pill 20-30 minutes before breakfast meal  daily (Patient not taking: Reported on 04/14/2022)   No facility-administered encounter medications on file as of 04/14/2022.     REVIEW OF SYSTEMS  : All other systems reviewed and negative except where noted in the History of Present Illness.   PHYSICAL EXAM: BP 118/72   Pulse 75   Ht 6' (1.829 m)   Wt 278 lb 9.6 oz (126.4 kg)   SpO2 98%   BMI 37.78 kg/m  General: Well developed white male in no acute distress Head: Normocephalic and atraumatic Eyes:  Sclerae anicteric, conjunctiva pink. Ears: Normal auditory acuity Musculoskeletal: Symmetrical with  no gross deformities  Skin: No lesions on visible extremities Extremities: No edema  Neurological: Alert oriented x 4, grossly non-focal Psychological:  Alert and cooperative. Normal mood and affect  ASSESSMENT AND PLAN: *H. pylori infection: Was treated with 10 days of Pylera back in June.  He says he completed that treatment.  Says that he feels well currently.  He we will perform H. pylori stool study to confirm eradication.  He needs to be off of his PPI for 2 weeks before doing this.  He says actually been off of the omeprazole for about a week now because he has had issues with insurance filling it.  Says that he may just start taking over-the-counter omeprazole, but will remain off of it until he performs a stool study.   CC:  London Pepper, MD

## 2022-04-14 NOTE — Progress Notes (Signed)
____________________________________________________________  Attending physician addendum:  Thank you for sending this case to me. I have reviewed the entire note and agree with the plan.  Of note, a week off PPI is sufficient to perform the H. pylori stool antigen.  Going forward, it would be best to use the newer Diatherix stool antigen that is accurate even with patients on PPI.  Wilfrid Lund, MD  ____________________________________________________________

## 2022-04-14 NOTE — Patient Instructions (Signed)
Your provider has requested that you go to the basement level for lab work before leaving today. Press "B" on the elevator. The lab is located at the first door on the left as you exit the elevator.  _______________________________________________________  If you are age 53 or older, your body mass index should be between 23-30. Your Body mass index is 37.78 kg/m. If this is out of the aforementioned range listed, please consider follow up with your Primary Care Provider.  If you are age 83 or younger, your body mass index should be between 19-25. Your Body mass index is 37.78 kg/m. If this is out of the aformentioned range listed, please consider follow up with your Primary Care Provider.   ________________________________________________________  The [facility] providers would like to encourage you to use [facility] to communicate with providers for non-urgent requests or questions.  Due to long hold times on the telephone, sending your provider a message by [facility] may be a faster and more efficient way to get a response.  Please allow 48 business hours for a response.  Please remember that this is for non-urgent requests.  _______________________________________________________

## 2022-04-15 ENCOUNTER — Ambulatory Visit
Admission: RE | Admit: 2022-04-15 | Discharge: 2022-04-15 | Disposition: A | Discharge status: Home / Self Care | Payer: No Typology Code available for payment source | Source: Ambulatory Visit | Attending: Psychiatry | Admitting: Psychiatry

## 2022-04-15 DIAGNOSIS — M5412 Radiculopathy, cervical region: Secondary | ICD-10-CM | POA: Diagnosis not present

## 2022-04-18 ENCOUNTER — Other Ambulatory Visit: Payer: Self-pay | Admitting: Psychiatry

## 2022-04-18 ENCOUNTER — Telehealth: Payer: Self-pay | Admitting: Psychiatry

## 2022-04-18 DIAGNOSIS — M4802 Spinal stenosis, cervical region: Secondary | ICD-10-CM

## 2022-04-18 NOTE — Telephone Encounter (Signed)
Referral for Orthopedics faxed to [facility]. Phone: [phone], Fax: [phone]

## 2022-05-05 ENCOUNTER — Other Ambulatory Visit: Payer: No Typology Code available for payment source

## 2022-05-05 DIAGNOSIS — A048 Other specified bacterial intestinal infections: Secondary | ICD-10-CM

## 2022-05-06 ENCOUNTER — Other Ambulatory Visit: Payer: Self-pay | Admitting: Psychiatry

## 2022-05-08 LAB — H. PYLORI ANTIGEN, STOOL: H pylori Ag, Stl: NEGATIVE

## 2022-05-12 ENCOUNTER — Encounter: Payer: Self-pay | Admitting: Cardiology

## 2022-05-12 ENCOUNTER — Ambulatory Visit: Payer: No Typology Code available for payment source | Attending: Cardiology | Admitting: Cardiology

## 2022-05-12 VITALS — BP 118/68 | HR 69 | Ht 72.0 in | Wt 279.2 lb

## 2022-05-12 DIAGNOSIS — E1169 Type 2 diabetes mellitus with other specified complication: Secondary | ICD-10-CM | POA: Diagnosis not present

## 2022-05-12 DIAGNOSIS — I2510 Atherosclerotic heart disease of native coronary artery without angina pectoris: Secondary | ICD-10-CM

## 2022-05-12 DIAGNOSIS — R000 Tachycardia, unspecified: Secondary | ICD-10-CM | POA: Diagnosis not present

## 2022-05-12 DIAGNOSIS — E785 Hyperlipidemia, unspecified: Secondary | ICD-10-CM

## 2022-05-12 DIAGNOSIS — R55 Syncope and collapse: Secondary | ICD-10-CM

## 2022-05-12 NOTE — Patient Instructions (Addendum)
Medication Instructions:    No changes   Okay for your Endocrinologist to increase to next dose of Cialis-   *If you need a refill on your cardiac medications before your next appointment, please call your pharmacy*   Lab Work: Not needed    Testing/Procedures:  Not needed  Follow-Up: At [facility], you and your health needs are our priority.  As part of our continuing mission to provide you with exceptional heart care, we have created designated Provider Care Teams.  These Care Teams include your primary Cardiologist (physician) and Advanced Practice Providers (APPs -  Physician Assistants and Nurse Practitioners) who all work together to provide you with the care you need, when you need it.     Your next appointment:   6 month(s)  The format for your next appointment:   In Person  Provider:   Glenetta Hew, MD

## 2022-05-12 NOTE — Progress Notes (Signed)
Primary Care Provider: London Pepper, [place] Cardiologist: Glenetta Hew, MD Electrophysiologist: None  Clinic Note: No chief complaint on file.   ===================================  ASSESSMENT/PLAN   Problem List Items Addressed This Visit       Cardiology Problems   Near syncope (Chronic)    More than likely, the episodes he was having are not related to cardiac etiology given the fact that his monitor was relatively stable as well as his echo. More likely related to his cervical stenosis and neck TIG Alapati as well as OSA.  Continue to hydrate and monitor.  Doing well.      Hyperlipidemia associated with type 2 diabetes mellitus (HCC) (Chronic)    Lipid management as noted.  Need to be able to review his lipids and, but low threshold to increase atorvastatin to 20 mg if not tolerating change to rosuvastatin.  (10 mg). Prediabetes strongly consider converting her Pedialyte as to a different to avoid edema and potential CHF complication.      Coronary artery disease, non-occlusive - Primary (Chronic)    Pretty reassuring findings on his coronary CTA with only like 25% diffuse plaque.  It does mean that we want to treat his lipids and cholesterol as well as blood pressure.  Would like to try to get a little bette lipid control.    He is due for having labs checked.  Once labs are checked we will able to see his repeat lipid levels we may need to increase atorvastatin to 20 mg daily..  Not able to tolerate that then we probably would convert Crestor/rosuvastatin 10 mg.        Other   Morbid obesity ([place]) (Chronic)    Needs to lose weight.  He is on Trulicity, may consider switching to [facility] or Ozempic which have better data on weight loss.  I think may be if his cervical stenosis related discomfort is treated he would be to get back and exercising and try to work on losing weight.  Need to adjust his diet as well.  Discussed dietary modification and  increasing exercise level.      Rapid or irregular heartbeat (Chronic)    Relatively benign monitor.  The symptoms improved as well.      ===================================  HPI:    Dean Farmer is a 51 y.o. male with a PMH below who presents today for 81-monthfollow-up: Initially seen for Chest Tightness and Pressure, then most recently for Near Syncope and Persistent Exertional Dyspnea/Chest Discomfort.  He is here to discuss results of his echo and monitor.  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  We discussed the results of his Coronary CTA indicating minimal CAD.  He said he was still having some off-and-on episodes of chest Punching his chest.  Somewhat associated with fluttering sensation with intermittent near syncope.  Felt like he was being "choked out-seeing stars and feeling rushing sensation like "Star Wars hyper drive ".  Had 1 episode that seem to be almost near syncopal.  Still pending sleep apnea evaluation.  Noted exertional dizziness and lightheadedness, exercise intolerance/fatigue and malaise.  Lots of headaches. Echo and monitor ordered  Recent Hospitalizations: None  Reviewed  CV studies:    The following studies were reviewed today: (if available, images/films reviewed: From Epic Chart or Care Everywhere) Transthoracic Echo  02/03/2022: EF 65-70.  Normal LV function and normal diastolic function.  Normal valves. Normal RV function.   Mildly dilated IVC and RA with RAP estimated 8 mmHg.  Unable to assess PAP. Zio patch monitor August 2023: Predominantly sinus rhythm with a rate of 45 to 156 bpm.  Average heart rate 74 bpm.  Rare PACs and PVCs with rare couplets and triplets.  No bigeminy or trigeminy noted.  1 short atrial burst of 5 beats with a max rate of 110 bpm.  No sustained  arrhythmias.  Interval History:   Dean Farmer presents here today with his neck in a brace.  He apparently just came from spine surgeon and neurology.  The running thought is that he has cervical nerve impingement that could very well be causing most of his symptoms.  Just being on the brace makes him feel better.  He is quite happy to potentially have a diagnosis.  He says that the knees rushing spells are pretty well-controlled now and is not having any more near syncope controlling his with brace.  He is looking forward to going forward with physical therapy and other treatment for his C-spine disease.  He also has been started on CPAP now and is breathing much better.  Much less dyspnea.  Much less fatigue.  He still does have some exertional dyspnea but less prominent palpitations or chest discomfort.  No PND orthopnea.  No real exertional chest pain or pressure, he still has a weird sensation in his chest, but not anginal in nature.  CV Review of Symptoms (Summary): Cardiovascular ROS: positive for - dyspnea on exertion, palpitations, shortness of breath, and this is similar to what was noted but not significant.  Feeling better, sleeping better. negative for - chest pain, edema, rapid heart rate, or syncope or near syncope TIA or amaurosis fugax.  Claudication.  REVIEWED OF SYSTEMS   Review of Systems  Constitutional:  Positive for malaise/fatigue (Still has a lot of fatigue, has tried over-the-counter with doing exercise.  Better with CPAP.). Negative for weight loss.  Respiratory:  Positive for shortness of breath (With less exertion than before.).   Cardiovascular:        Per HPI  Genitourinary:  Negative for hematuria.  Musculoskeletal:  Positive for back pain, joint pain and neck pain. Negative for falls.  Neurological:  Positive for dizziness (Notably improved.) and focal weakness (He still has weakness in his arms probably from cervical radiculopathy.).  Psychiatric/Behavioral:   Negative for depression (Still little dysthymic, but otherwise okay) and substance abuse. The patient is nervous/anxious (Still anxious, but feeling much better that he has a diagnosis). The patient does not have insomnia (Sleeping much better with CPAP).    I have reviewed and (if needed) personally updated the patient's problem list, medications, allergies, past medical and surgical history, social and family history.   PAST MEDICAL HISTORY   Past Medical History:  Diagnosis Date   Anxiety    On Celexa   Diabetes mellitus without complication ([place])    On combination of Trulicity, Jardiance, Tresiba (insulin) and Actos.   Former heavy tobacco smoker    Quit in June 2022   GERD (gastroesophageal reflux disease)    Hyperlipidemia    Low testosterone    Low vitamin D level    Sleep apnea    Pending CPAP titration    PAST SURGICAL HISTORY   Past Surgical History:  Procedure Laterality Date   CARPAL TUNNEL RELEASE  CT CTA CORONARY W/CA SCORE W/CM &/OR WO/CM  01/11/2022   Coronary Calcium Score 0.  Normal coronary origin with left dominance.  Diffuse minimal plaque less than 25%.  Normal coronary artery size.   TENDON REPAIR     WISDOM TOOTH EXTRACTION       There is no immunization history on file for this patient.  MEDICATIONS/ALLERGIES   Current Meds  Medication Sig   atorvastatin (LIPITOR) 10 MG tablet Take 10 mg by mouth daily.   empagliflozin (JARDIANCE) 25 MG TABS tablet Take 25 mg by mouth daily.   escitalopram (LEXAPRO) 20 MG tablet Take 20 mg by mouth daily.   indomethacin (INDOCIN) 25 MG capsule Take one pill 30 minutes prior to intercourse to prevent sex headache   methocarbamol (ROBAXIN-750) 750 MG tablet Take 1 tablet (750 mg total) by mouth at bedtime as needed for muscle spasms.   omeprazole (PRILOSEC) 40 MG capsule Take 1 capsule (40 mg total) by mouth daily. Take one pill 20-30 minutes before breakfast meal daily   pioglitazone (ACTOS) 45 MG tablet Take  45 mg by mouth daily.   topiramate (TOPAMAX) 100 MG tablet THEN INCREASE TO 1 TABLET TWICE A DAY   TRESIBA FLEXTOUCH 200 UNIT/ML FlexTouch Pen SMARTSIG:15 Unit(s) SUB-Q Daily   TRULICITY 3 NW/2.9FA SOPN SMARTSIG:3 Milligram(s) SUB-Q Every 4 Weeks    Allergies  Allergen Reactions   Ozempic (0.25 Or 0.5 Mg-Dose) [Semaglutide(0.25 Or 0.'5mg'$ -Dos)] Nausea And Vomiting    SOCIAL HISTORY/FAMILY HISTORY   Reviewed in Epic:  Pertinent findings:  Social History   Tobacco Use   Smoking status: Former    Packs/day: 1.00    Types: Cigarettes    Quit date: 12/19/2020    Years since quitting: 1.4   Smokeless tobacco: Former    Types: Nurse, children's Use: Never used  Substance Use Topics   Alcohol use: Not Currently    Comment: Rare alcohol-beer wine and liquor.   Drug use: No   Social History   Social History Narrative   Caffeine- 5- 6 cups a day    Left handed    Lives at home with wife-1 daughter      No structural exercise.  Walks some.      I indicated he had a very difficult time during all the death of his mother 4 years ago.  At that time he basically lost control and stopped taking care of himself for quite a while.  He is now trying to "pick up the pieces ".       OBJCTIVE -PE, EKG, labs   Wt Readings from Last 3 Encounters:  05/12/22 279 lb 3.2 oz (126.6 kg)  04/14/22 278 lb 9.6 oz (126.4 kg)  03/28/22 279 lb 3.2 oz (126.6 kg)    Physical Exam: BP 118/68   Pulse 69   Ht 6' (1.829 m)   Wt 279 lb 3.2 oz (126.6 kg)   SpO2 97%   BMI 37.87 kg/m  Physical Exam Constitutional:      Appearance: Normal appearance. He is obese. He is ill-appearing (Comes in somewhat disheveled but.  Mild chronically ill-appearing.  Wearing a neck brace.  Essentially morbidly obese-BMI is almost 22 with multiple risk factors.).  HENT:     Head: Normocephalic and atraumatic.  Cardiovascular:     Rate and Rhythm: Normal rate and regular rhythm.  Pulmonary:     Effort:  Pulmonary effort is normal. No respiratory distress.     Breath  sounds: Normal breath sounds. No wheezing, rhonchi or rales.  Musculoskeletal:        General: Swelling (Trace-1+) present. Normal range of motion.     Cervical back: Normal range of motion and neck supple.  Skin:    General: Skin is warm and dry.  Neurological:     General: No focal deficit present.     Mental Status: He is alert and oriented to person, place, and time.  Psychiatric:        Mood and Affect: Mood normal.        Behavior: Behavior normal.        Thought Content: Thought content normal.        Judgment: Judgment normal.     Adult ECG Report Not checked  Recent Labs: Reviewed No results found for: "CHOL", "HDL", "LDLCALC", "LDLDIRECT", "TRIG", "CHOLHDL" Lab Results  Component Value Date   CREATININE 0.92 12/19/2021   BUN 16 12/19/2021   NA 141 12/19/2021   K 4.1 12/19/2021   CL 108 (H) 12/19/2021   CO2 19 (L) 12/19/2021      Latest Ref Rng & Units 12/09/2017    8:10 PM  CBC  WBC 4.0 - 10.5 K/uL 9.5   Hemoglobin 13.0 - 17.0 g/dL 16.4   Hematocrit 39.0 - 52.0 % 45.8   Platelets 150 - 400 K/uL 162     No results found for: "HGBA1C" No results found for: "TSH"  ================================================== I spent a total of 21 minutes with the patient spent in direct patient consultation.  Additional time spent with chart review  / charting (studies, outside notes, etc): 20 min Total Time: 41 min  Current medicines are reviewed at length with the patient today.  (+/- concerns) N/A  Notice: This dictation was prepared with Dragon dictation along with smart phrase technology. Any transcriptional errors that result from this process are unintentional and may not be corrected upon review.  Studies Ordered:   No orders of the defined types were placed in this encounter.  No orders of the defined types were placed in this encounter.   Patient Instructions / Medication Changes & Studies  & Tests Ordered   Patient Instructions  Medication Instructions:    No changes   Okay for your Endocrinologist to increase to next dose of Cialis-   *If you need a refill on your cardiac medications before your next appointment, please call your pharmacy*   Lab Work: Not needed    Testing/Procedures:  Not needed  Follow-Up: At [facility], you and your health needs are our priority.  As part of our continuing mission to provide you with exceptional heart care, we have created designated Provider Care Teams.  These Care Teams include your primary Cardiologist (physician) and Advanced Practice Providers (APPs -  Physician Assistants and Nurse Practitioners) who all work together to provide you with the care you need, when you need it.     Your next appointment:   6 month(s)  The format for your next appointment:   In Person  Provider:   Glenetta Hew, MD     Leonie Man, MD, MS Glenetta Hew, M.D., M.S. Interventional Cardiologist  [place]  Pager # [phone] Phone # [phone] [address] [place], [place] [address]   Thank you for choosing [facility]!!

## 2022-06-10 NOTE — Assessment & Plan Note (Addendum)
Pretty reassuring findings on his coronary CTA with only like 25% diffuse plaque.  It does mean that we want to treat his lipids and cholesterol as well as blood pressure.  Would like to try to get a little bette lipid control.    He is due for having labs checked.  Once labs are checked we will able to see his repeat lipid levels we may need to increase atorvastatin to 20 mg daily..  Not able to tolerate that then we probably would convert Crestor/rosuvastatin 10 mg.

## 2022-06-10 NOTE — Assessment & Plan Note (Signed)
Lipid management as noted.  Need to be able to review his lipids and, but low threshold to increase atorvastatin to 20 mg if not tolerating change to rosuvastatin.  (10 mg). Prediabetes strongly consider converting her Pedialyte as to a different to avoid edema and potential CHF complication.

## 2022-06-10 NOTE — Assessment & Plan Note (Signed)
Relatively benign monitor.  The symptoms improved as well.

## 2022-06-10 NOTE — Assessment & Plan Note (Signed)
More than likely, the episodes he was having are not related to cardiac etiology given the fact that his monitor was relatively stable as well as his echo. More likely related to his cervical stenosis and neck TIG Alapati as well as OSA.  Continue to hydrate and monitor.  Doing well.

## 2022-06-10 NOTE — Assessment & Plan Note (Signed)
Needs to lose weight.  He is on Trulicity, may consider switching to [facility] or Ozempic which have better data on weight loss.  I think may be if his cervical stenosis related discomfort is treated he would be to get back and exercising and try to work on losing weight.  Need to adjust his diet as well.  Discussed dietary modification and increasing exercise level.

## 2022-10-04 ENCOUNTER — Telehealth: Payer: Self-pay | Admitting: Psychiatry

## 2022-10-04 NOTE — Telephone Encounter (Signed)
LVM and sent mychart msg informing pt of need to reschedule 10/23/22 appointment - MD out

## 2022-10-23 ENCOUNTER — Ambulatory Visit: Payer: No Typology Code available for payment source | Admitting: Psychiatry

## 2022-10-27 ENCOUNTER — Other Ambulatory Visit (HOSPITAL_COMMUNITY): Payer: Self-pay

## 2022-10-27 MED ORDER — MOUNJARO 5 MG/0.5ML ~~LOC~~ SOAJ
5.0000 mg | SUBCUTANEOUS | 3 refills | Status: AC
  Filled 2022-10-27: qty 2, 28d supply, fill #0
  Filled 2022-11-28: qty 2, 28d supply, fill #1

## 2022-10-30 ENCOUNTER — Other Ambulatory Visit (HOSPITAL_COMMUNITY): Payer: Self-pay

## 2022-11-02 ENCOUNTER — Other Ambulatory Visit (HOSPITAL_COMMUNITY): Payer: Self-pay

## 2022-11-03 ENCOUNTER — Other Ambulatory Visit (HOSPITAL_COMMUNITY): Payer: Self-pay

## 2022-11-04 ENCOUNTER — Other Ambulatory Visit (HOSPITAL_COMMUNITY): Payer: Self-pay

## 2022-12-08 ENCOUNTER — Ambulatory Visit: Payer: No Typology Code available for payment source | Admitting: Cardiology

## 2023-01-08 ENCOUNTER — Other Ambulatory Visit: Payer: Self-pay | Admitting: Gastroenterology

## 2023-01-24 ENCOUNTER — Other Ambulatory Visit (HOSPITAL_COMMUNITY): Payer: Self-pay

## 2023-01-24 MED ORDER — MOUNJARO 7.5 MG/0.5ML ~~LOC~~ SOAJ
7.5000 mg | SUBCUTANEOUS | 1 refills | Status: AC
  Filled 2023-01-24: qty 2, 28d supply, fill #0
  Filled 2023-02-26: qty 2, 28d supply, fill #1

## 2023-02-13 ENCOUNTER — Ambulatory Visit: Payer: No Typology Code available for payment source | Admitting: Neurology

## 2023-02-13 ENCOUNTER — Encounter: Payer: Self-pay | Admitting: Neurology

## 2023-02-13 VITALS — BP 128/78 | Ht 72.0 in | Wt 281.0 lb

## 2023-02-13 DIAGNOSIS — G4730 Sleep apnea, unspecified: Secondary | ICD-10-CM | POA: Insufficient documentation

## 2023-02-13 DIAGNOSIS — G4733 Obstructive sleep apnea (adult) (pediatric): Secondary | ICD-10-CM

## 2023-02-13 DIAGNOSIS — R519 Headache, unspecified: Secondary | ICD-10-CM

## 2023-02-13 DIAGNOSIS — G43719 Chronic migraine without aura, intractable, without status migrainosus: Secondary | ICD-10-CM

## 2023-02-13 DIAGNOSIS — G43909 Migraine, unspecified, not intractable, without status migrainosus: Secondary | ICD-10-CM | POA: Insufficient documentation

## 2023-02-13 MED ORDER — TOPIRAMATE 100 MG PO TABS: ORAL_TABLET | ORAL | 1 refills | Status: DC

## 2023-02-13 MED ORDER — INDOMETHACIN 25 MG PO CAPS: ORAL_CAPSULE | ORAL | 3 refills | Status: AC

## 2023-02-13 NOTE — Progress Notes (Addendum)
Patient: Dean Farmer Date of Birth: [birth year]  Reason for Visit: Follow up History from: Patient Primary Neurologist: Dean Farmer-headaches/Dean Farmer-OSA  ASSESSMENT AND PLAN 52 y.o. year old male   1.  OSA on CPAP -Will restart CPAP, will reach out to DME to purchase supplies, once he restarts will let me know and I can pull a download. Finances have been tight, not able to purchase new supplies. He is motivated to restart however.  -HST June 2023 showed moderate OSA, established on CPAP July 2023  2.  Chronic migraine headaches 3.  Primary sex headache -He is going to restart Topamax working back up to 100 mg BID for headache prevention  -Reorder indomethacin 25 mg PRN 30-60 minutes prior to sexual activity  -Next steps: PRN triptan, propranolol -Follow-up in 6 months or sooner if needed  4.  Cervical stenosis -Seeing orthopedics, good benefit with PT dry needling -Addendum 03/08/23 saw Dr. Shon Farmer 02/28/2023 excellent progress with PT.  Did not feel surgical candidate currently.  Referral to pain management for evaluation of injections.  Continue Lyrica.  May consider surgical management of disc herniation in the future.  Addendum 04/04/2023 SS: Saw Dr. Horald Farmer 04/03/2023 at this time, surgery is not recommended.  Could benefit from left C6 nerve root block.  However he is hesitant since he is a diabetic.  He is managing with Lyrica.  Follow-up 3 months.  HISTORY OF PRESENT ILLNESS: Today 02/13/23 Saw Dr. Frances Farmer in August 2023 for CPAP, had good compliance and good benefit. Saw Dr. Delena Farmer September 2023, continue Topamax 100 mg twice daily, indomethacin 25 mg as needed.  MRI cervical spine showed moderately severe spinal stenosis at C5-C6 with potential for C6 nerve root compression.  Was referred to orthopedics.   Is not using CPAP, due to expense of supplies even with insurance. Without CPAP, has not noted much change. He is motivated to get back to using. Needs to call DME, thinks he has met  his deductible now.  Stopped Topamax, he ran out. Has been cutting back on a lot of his medications. Headaches are not as bad. Seeing Dr. Shon Farmer for his neck, completed PT with dry needling, tried Lyrica helped a lot, claims can't be renewed. Still has daily headache, are occipital, come around to temples. Takes ibuprofen PRN, 3-4 times a week. Has not taken Indomethacin. Primary sex type headache is much better, is not having sexual activity, if he does, intensity is much less. Working on weight loss, not much so far.  HISTORY  03/28/22 Dean Farmer: Brief HPI: 52 year old male with a history of DM, HLD, OSA on AutoPap who follows in clinic for chronic migraines following COVID infection. He also reports thunderclap headaches associated with sexual activity. MRI/MRA 06/20/21 were unremarkable.   At his last visit, Topamax was increased to 100 mg BID. Indomethacin was started as needed for primary sex headache.   Interval History: He continues to have daily headaches, but they are less severe on the Topamax 100 mg BID. He is tolerating this well without side effects. Has a lot of neck pain and tension. Can't lift his arms above his head due to pain and weakness. He does have a history of neck trauma which precipitated his symptoms. He has never had neck imaging.   Indomethacin has not been helpful for sex headaches, but does help with his other headaches.   Sleep study showed moderate OSA and he was started on AutoPap. This has helped with his sleep and headaches  Headache days per month: 30 Headache free days per month: 0   Current Headache Regimen: Preventative: Topamax 100 mg BID Abortive: Indomethacin 25 mg PRN     Prior Therapies                                  Topamax 100 mg BID Lexapro 20 mg daily Cymbalta 30 mg BID Indomethacin 25 mg PRN   02/23/23 Dean Farmer: Dean Farmer is a 52 year old right-handed gentleman with an underlying medical history of diabetes, reflux disease, recurrent  headaches, low vitamin D, hyperlipidemia, anxiety and obesity, who presents for follow-up consultation of his obstructive sleep apnea after interim testing and starting AutoPap therapy.  The patient is unaccompanied today.  I first met him at the request of Dr. Delena Farmer on 10/11/2021, at which time he reported a prior diagnosis of obstructive sleep apnea.  He no longer had a CPAP machine.  He was advised to proceed with sleep testing.  He had a home sleep test on 12/05/2021 which indicated moderate obstructive sleep apnea with a total AHI of 21.1/hour and O2 nadir of 85%.  Mild to moderate snoring was detected.  He was advised to proceed with AutoPap therapy.  His set up date was 01/06/2022.  He has a ResMed air sense 11 AutoSet machine.   Today, 02/22/2022: I reviewed his AutoPap compliance data from 01/22/2022 through 02/20/2022, which is a total of 30 days, during which time he used his machine 26 days with percent use days greater than 4 hours at 80%, indicating very good compliance with an average usage of 6 hours and 10 minutes, residual AHI at goal at 1.2/h, average pressure for the 95th percentile at 11.2 cm with a range of 6 to 13 cm.  Leak acceptable with the 95th percentile at 12.3 L/min.  He reports doing well, feels, better with AutoPap therapy, is still adjusting to treatment but overall is doing well.  He feels less tired, headaches are less intense and overall better but still has them.  He does have a follow-up appointment coming up with Dr. Delena Farmer next month.  He is in the process of moving and settling into a new apartment.  Sadly, they recently lost their puppy.  He is working on weight loss.  He has had instances of falling asleep without putting the mask on but other than that he is consistent with it and very motivated to continue with treatment.  He is using a fullface mask.  He does have a little bit of nose dryness but reports that he has not yet started using the humidifier in the AutoPap machine,  has not really had a chance to get distilled water yet.  His work schedule has changed to Monday through Thursday, 7 AM to 5 PM daily.  He tries to get his doctors appointments done on Fridays for that reason.  REVIEW OF SYSTEMS: Out of a complete 14 system review of symptoms, the patient complains only of the following symptoms, and all other reviewed systems are negative.  See HPI  ALLERGIES: Allergies  Allergen Reactions   Ozempic (0.25 Or 0.5 Mg-Dose) [Semaglutide(0.25 Or 0.5mg -Dos)] Nausea And Vomiting    HOME MEDICATIONS: Outpatient Medications Prior to Visit  Medication Sig Dispense Refill   empagliflozin (JARDIANCE) 25 MG TABS tablet Take 25 mg by mouth daily.     omeprazole (PRILOSEC) 40 MG capsule TAKE 1 CAPSULE (40 MG TOTAL) BY MOUTH DAILY.  TAKE ONE PILL 20-30 MINUTES BEFORE BREAKFAST MEAL DAILY 90 capsule 1   pioglitazone (ACTOS) 45 MG tablet Take 45 mg by mouth daily.     tirzepatide (MOUNJARO) 7.5 MG/0.5ML Pen Inject 7.5 mg into the skin once a week. 2 mL 1   TRESIBA FLEXTOUCH 200 UNIT/ML FlexTouch Pen SMARTSIG:15 Unit(s) SUB-Q Daily     atorvastatin (LIPITOR) 10 MG tablet Take 20 mg by mouth daily. (Patient not taking: Reported on 02/13/2023)     escitalopram (LEXAPRO) 20 MG tablet Take 20 mg by mouth daily. (Patient not taking: Reported on 02/13/2023)     methocarbamol (ROBAXIN-750) 750 MG tablet Take 1 tablet (750 mg total) by mouth at bedtime as needed for muscle spasms. (Patient not taking: Reported on 02/13/2023) 30 tablet 6   rosuvastatin (CRESTOR) 10 MG tablet Take 10 mg by mouth daily.     tirzepatide [facility]) 5 MG/0.5ML Pen Inject 5 mg into the skin once a week. (Patient not taking: Reported on 02/13/2023) 2 mL 3   TRULICITY 3 MG/0.5ML SOPN SMARTSIG:3 Milligram(s) SUB-Q Every 4 Weeks (Patient not taking: Reported on 02/13/2023)     indomethacin (INDOCIN) 25 MG capsule Take one pill 30 minutes prior to intercourse to prevent sex headache (Patient not taking: Reported on  02/13/2023) 30 capsule 3   topiramate (TOPAMAX) 100 MG tablet THEN INCREASE TO 1 TABLET TWICE A DAY (Patient not taking: Reported on 02/13/2023) 180 tablet 1   No facility-administered medications prior to visit.    PAST MEDICAL HISTORY: Past Medical History:  Diagnosis Date   Anxiety    On Celexa   Diabetes mellitus without complication (HCC)    On combination of Trulicity, Jardiance, Tresiba (insulin) and Actos.   Former heavy tobacco smoker    Quit in June 2022   GERD (gastroesophageal reflux disease)    Hyperlipidemia    Low testosterone    Low vitamin D level    Sleep apnea    Pending CPAP titration    PAST SURGICAL HISTORY: Past Surgical History:  Procedure Laterality Date   CARPAL TUNNEL RELEASE     CT CTA CORONARY W/CA SCORE W/CM &/OR WO/CM  01/11/2022   Coronary Calcium Score 0.  Normal coronary origin with left dominance.  Diffuse minimal plaque less than 25%.  Normal coronary artery size.   TENDON REPAIR     WISDOM TOOTH EXTRACTION      FAMILY HISTORY: Family History  Problem Relation Age of Onset   Diabetes Mother    Cancer - Other Mother    COPD Mother    Dementia Mother    Diabetes Father    Heart attack Father 23   Hyperlipidemia Father    Autoimmune disease Daughter    Polycystic ovary syndrome Daughter    Colon polyps Paternal Aunt        x 2   Colon cancer Paternal Uncle    Stomach cancer Paternal Uncle    Diabetes Other    Heart disease Other    Esophageal cancer Neg Hx    Rectal cancer Neg Hx    Sleep apnea Neg Hx     SOCIAL HISTORY: Social History   Socioeconomic History   Marital status: Married    Spouse name: Not on file   Number of children: 1   Years of education: Not on file   Highest education level: GED or equivalent  Occupational History   Occupation: Location manager    Comment: [employer]  Tobacco Use   Smoking status:  Former    Current packs/day: 0.00    Types: Cigarettes    Quit date: 12/19/2020    Years since quitting:  2.1   Smokeless tobacco: Former    Types: Engineer, drilling   Vaping status: Never Used  Substance and Sexual Activity   Alcohol use: Not Currently    Comment: Rare alcohol-beer wine and liquor.   Drug use: No   Sexual activity: Not on file  Other Topics Concern   Not on file  Social History Narrative   Caffeine- 5- 6 cups a day    Left handed    Lives at home with wife-1 daughter      No structural exercise.  Walks some.      I indicated he had a very difficult time during all the death of his mother 4 years ago.  At that time he basically lost control and stopped taking care of himself for quite a while.  He is now trying to "pick up the pieces ".      Social Determinants of Health   Financial Resource Strain: Not on file  Food Insecurity: Not on file  Transportation Needs: Not on file  Physical Activity: Not on file  Stress: Not on file  Social Connections: Not on file  Intimate Partner Violence: Not on file   PHYSICAL EXAM  Vitals:   02/13/23 0744  BP: 128/78  Weight: 281 lb (127.5 kg)  Height: 6' (1.829 m)   Body mass index is 38.11 kg/m.  Generalized: Well developed, in no acute distress  Neurological examination  Mentation: Alert oriented to time, place, history taking. Follows all commands speech and language fluent Cranial nerve II-XII: Pupils were equal round reactive to light. Extraocular movements were full, visual field were full on confrontational test. Facial sensation and strength were normal. Head turning and shoulder shrug  were normal and symmetric. Motor: The motor testing reveals 5 over 5 strength of all 4 extremities. Good symmetric motor tone is noted throughout.  Sensory: Sensory testing is intact to soft touch on all 4 extremities. No evidence of extinction is noted.  Coordination: Cerebellar testing reveals good finger-nose-finger and heel-to-shin bilaterally.  Gait and station: Gait is normal.   Reflexes: Deep tendon reflexes are symmetric  and normal bilaterally.   DIAGNOSTIC DATA (LABS, IMAGING, TESTING) - I reviewed patient records, labs, notes, testing and imaging myself where available.  Lab Results  Component Value Date   WBC 9.5 12/09/2017   HGB 16.4 12/09/2017   HCT 45.8 12/09/2017   MCV 86.3 12/09/2017   PLT 162 12/09/2017      Component Value Date/Time   NA 141 12/19/2021 1208   K 4.1 12/19/2021 1208   CL 108 (H) 12/19/2021 1208   CO2 19 (L) 12/19/2021 1208   GLUCOSE 126 (H) 12/19/2021 1208   GLUCOSE 369 (H) 12/09/2017 2010   BUN 16 12/19/2021 1208   CREATININE 0.92 12/19/2021 1208   CALCIUM 9.0 12/19/2021 1208   PROT 8.0 12/09/2017 2010   ALBUMIN 4.6 12/09/2017 2010   AST 35 12/09/2017 2010   ALT 21 12/09/2017 2010   ALKPHOS 96 12/09/2017 2010   BILITOT 0.6 12/09/2017 2010   GFRNONAA >60 12/09/2017 2010   GFRAA >60 12/09/2017 2010   No results found for: "CHOL", "HDL", "LDLCALC", "LDLDIRECT", "TRIG", "CHOLHDL" No results found for: "HGBA1C" No results found for: "VITAMINB12" No results found for: "TSH"  Margie Ege, AGNP-C, DNP 02/13/2023, 8:09 AM [facility] [address] [place], [place] [address] [phone]

## 2023-02-13 NOTE — Patient Instructions (Signed)
Restart Topamax 50 mg twice daily x 2 weeks then 100 mg twice daily stay at this dose for headache prevention   Use Indomethacin as needed prior to sexual activity   Restart CPAP, call your DME about supplies for CPAP, if you restart let me know so I can pull download

## 2023-02-26 ENCOUNTER — Other Ambulatory Visit: Payer: Self-pay

## 2023-02-26 ENCOUNTER — Other Ambulatory Visit (HOSPITAL_COMMUNITY): Payer: Self-pay

## 2023-03-19 ENCOUNTER — Other Ambulatory Visit (HOSPITAL_COMMUNITY): Payer: Self-pay

## 2023-03-19 MED ORDER — MOUNJARO 10 MG/0.5ML ~~LOC~~ SOAJ
10.0000 mg | SUBCUTANEOUS | 3 refills | Status: DC
  Filled 2023-03-19: qty 2, 28d supply, fill #0
  Filled 2023-04-16: qty 2, 28d supply, fill #1
  Filled 2023-05-12: qty 2, 28d supply, fill #2
  Filled 2023-06-11: qty 2, 28d supply, fill #3

## 2023-03-20 ENCOUNTER — Other Ambulatory Visit (HOSPITAL_COMMUNITY): Payer: Self-pay

## 2023-03-22 ENCOUNTER — Other Ambulatory Visit (HOSPITAL_COMMUNITY): Payer: Self-pay

## 2023-04-19 ENCOUNTER — Other Ambulatory Visit (HOSPITAL_COMMUNITY): Payer: Self-pay

## 2023-05-15 ENCOUNTER — Other Ambulatory Visit (HOSPITAL_COMMUNITY): Payer: Self-pay

## 2023-06-14 ENCOUNTER — Other Ambulatory Visit (HOSPITAL_COMMUNITY): Payer: Self-pay

## 2023-07-09 ENCOUNTER — Other Ambulatory Visit (HOSPITAL_COMMUNITY): Payer: Self-pay

## 2023-07-10 ENCOUNTER — Other Ambulatory Visit (HOSPITAL_BASED_OUTPATIENT_CLINIC_OR_DEPARTMENT_OTHER): Payer: Self-pay

## 2023-07-10 ENCOUNTER — Other Ambulatory Visit (HOSPITAL_COMMUNITY): Payer: Self-pay

## 2023-07-10 IMAGING — CR DG HAND COMPLETE 3+V*R*
3 series · 3 of 3 positions shown · non-contrast
Comparison: 10/19/2009.

CLINICAL DATA: Right hand pain. Knot on base of right thumb. No
known injury.

EXAM:
RIGHT HAND - COMPLETE 3+ VIEW

[x hand pa right]
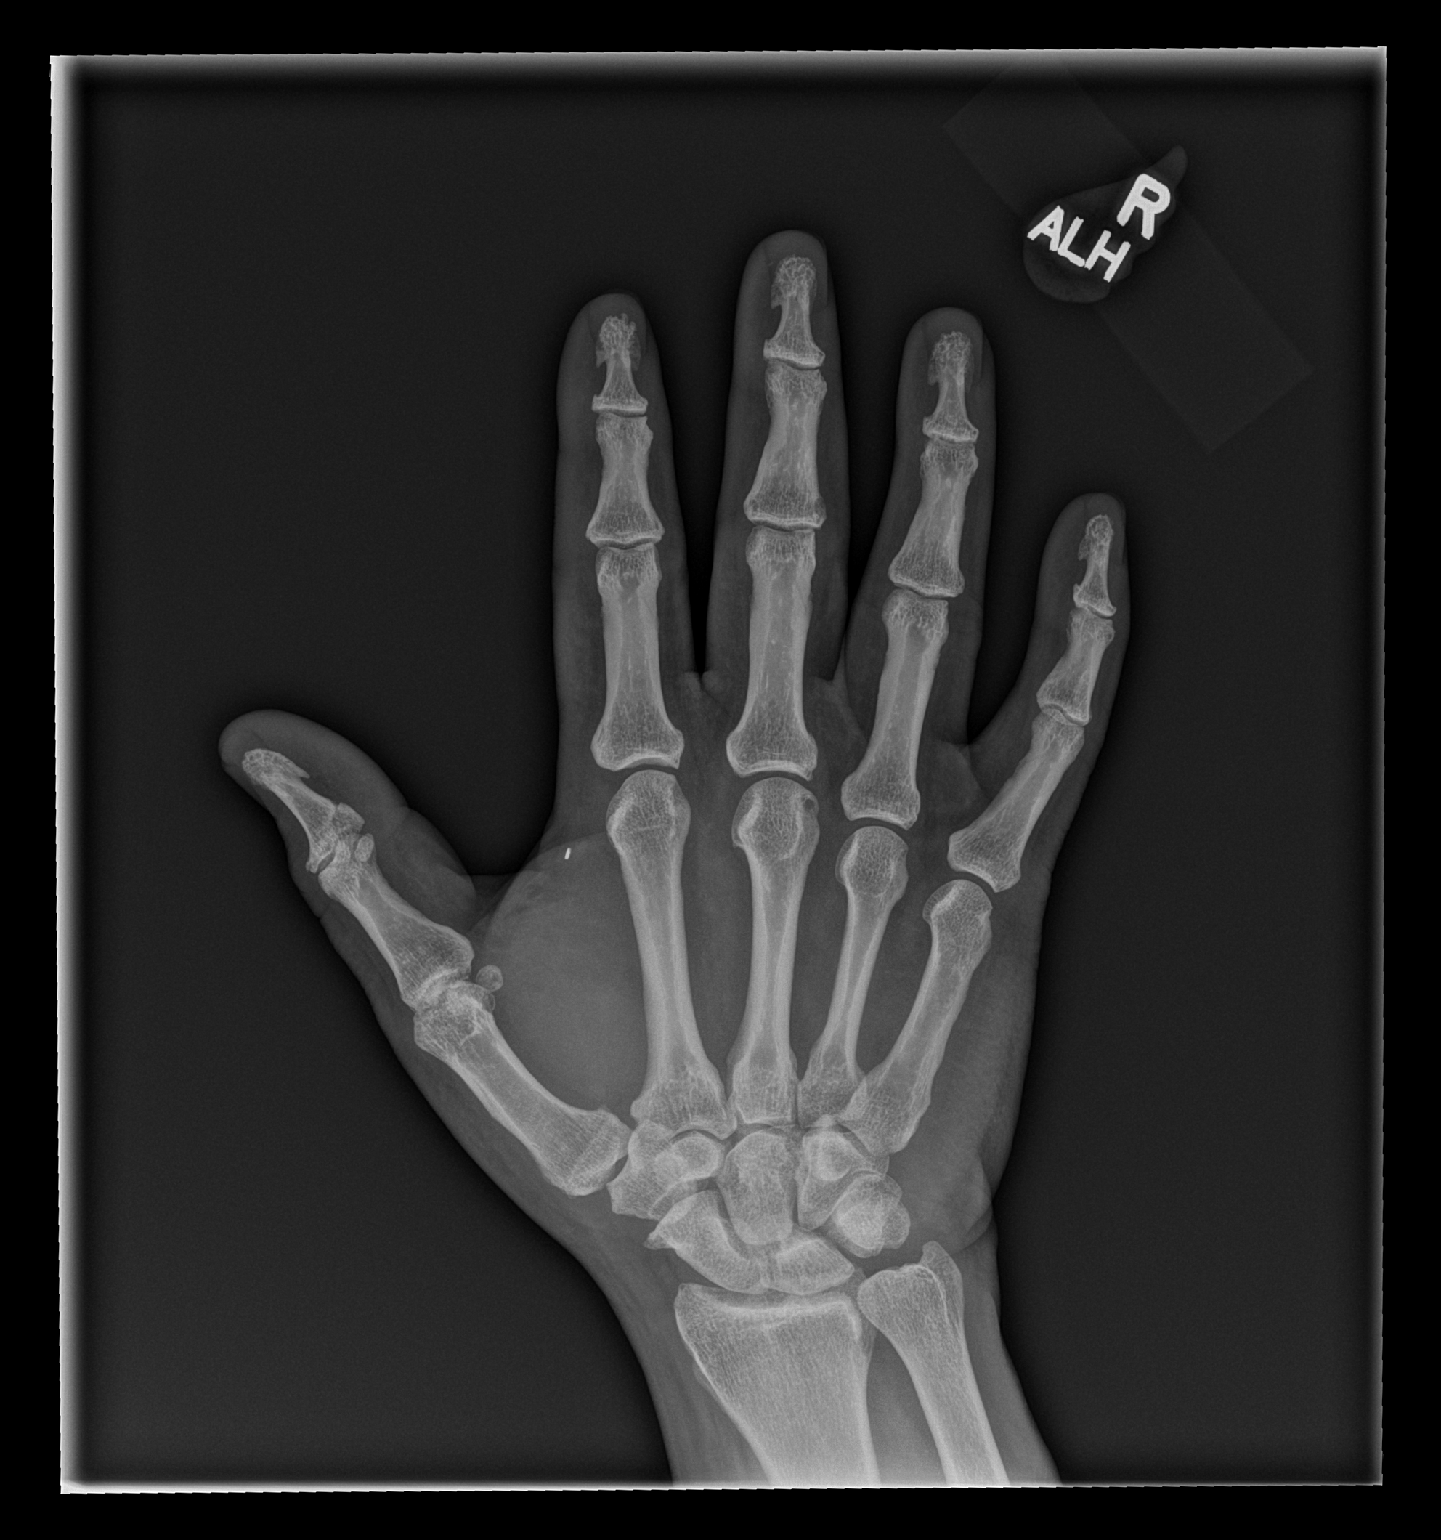

[x hand obl right]
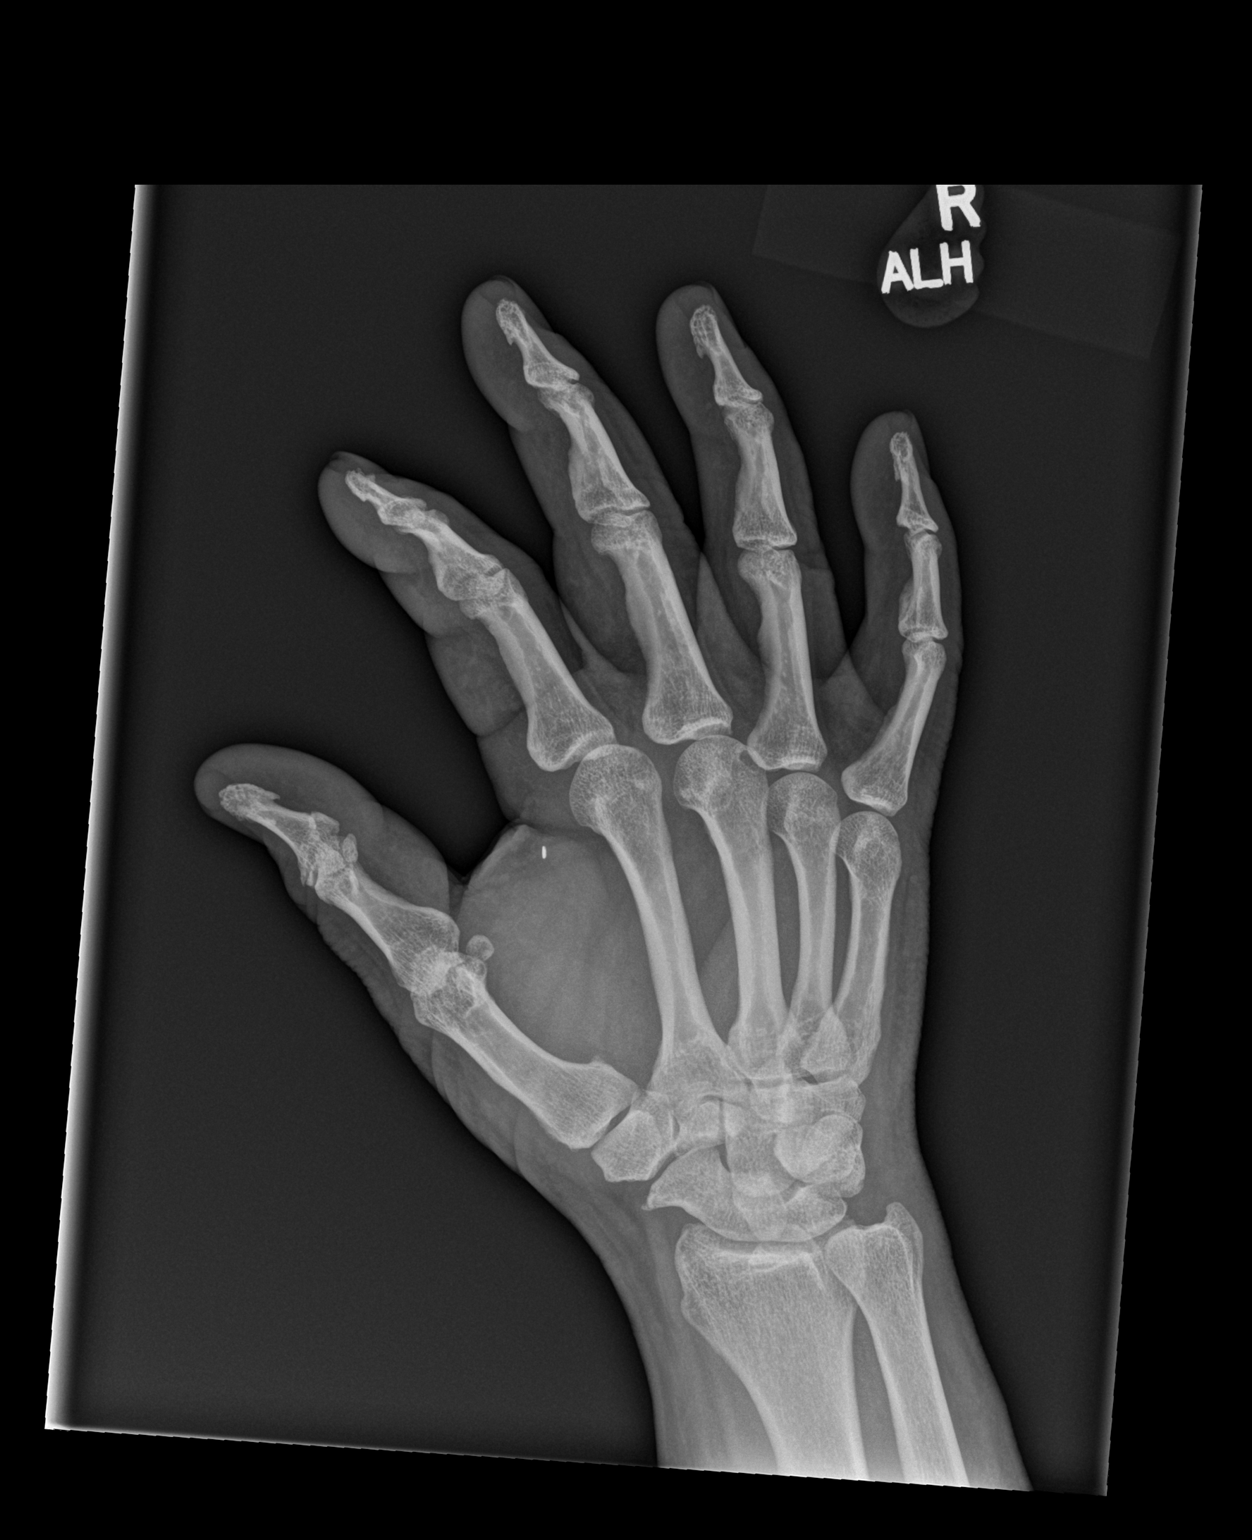

[x hand lat right]
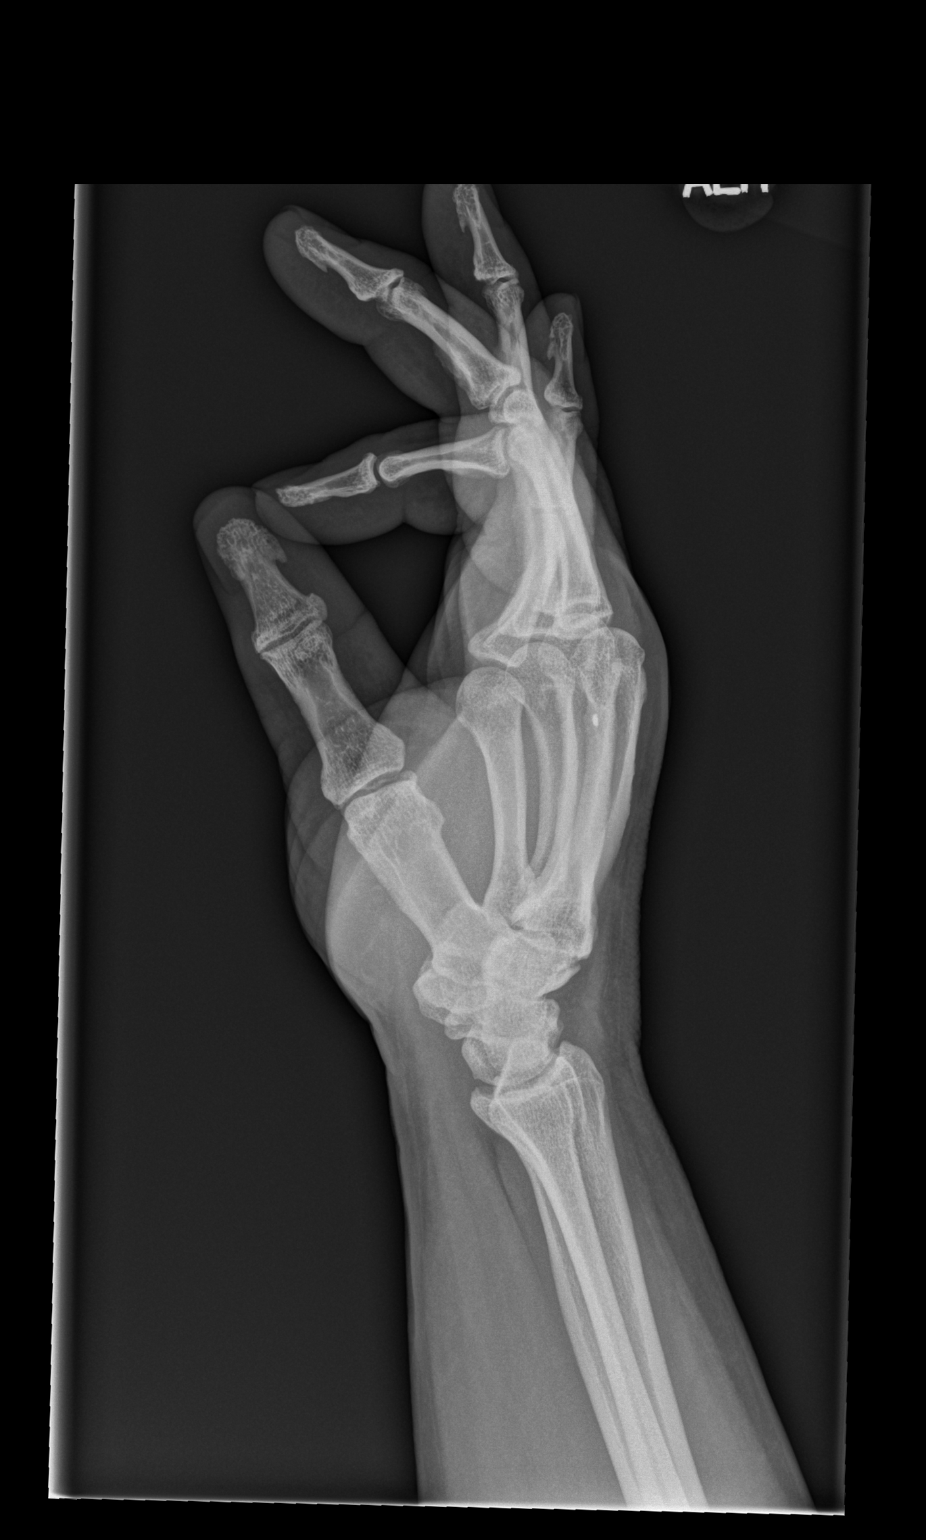

[3 of 3 positions shown; findings below may reference images not displayed]

FINDINGS: Diffuse degenerative change. Degenerative changes most prominent
about the radiocarpal and first metacarpal phalangeal joints. Small
subchondral cyst noted in the distal right third metacarpal most
likely degenerative. Stable deformity noted at the base of the
distal phalanx of the right thumb, most likely related old injury.
No acute bony abnormality identified. No evidence of acute fracture.
Small radiopaque foreign body again noted over the soft tissues
between the thumb and second digit.
IMPRESSION: 1. Diffuse degenerative change. Degenerative changes most prominent
about the radiocarpal and first metacarpal phalangeal joints. Stable
deformity noted the base of the distal phalanx of the right thumb,
most likely related old injury. No acute bony abnormality
identified.

2. Small radiopaque foreign body again noted over the soft tissues
between the thumb and second digit.

## 2023-07-10 MED ORDER — MOUNJARO 10 MG/0.5ML ~~LOC~~ SOAJ
10.0000 mg | SUBCUTANEOUS | 3 refills | Status: AC
  Filled 2023-07-10: qty 2, 28d supply, fill #0
  Filled 2023-08-07: qty 2, 28d supply, fill #1
  Filled 2023-09-03: qty 2, 28d supply, fill #2

## 2023-07-11 ENCOUNTER — Other Ambulatory Visit (HOSPITAL_COMMUNITY): Payer: Self-pay

## 2023-08-07 ENCOUNTER — Other Ambulatory Visit (HOSPITAL_COMMUNITY): Payer: Self-pay

## 2023-08-23 ENCOUNTER — Ambulatory Visit: Payer: No Typology Code available for payment source | Admitting: Neurology

## 2023-09-07 ENCOUNTER — Other Ambulatory Visit (HOSPITAL_COMMUNITY): Payer: Self-pay

## 2023-09-25 ENCOUNTER — Other Ambulatory Visit (HOSPITAL_COMMUNITY): Payer: Self-pay

## 2023-09-25 MED ORDER — MOUNJARO 12.5 MG/0.5ML ~~LOC~~ SOAJ
12.5000 mg | SUBCUTANEOUS | 3 refills | Status: DC
  Filled 2023-09-25: qty 2, 28d supply, fill #0
  Filled 2023-11-01: qty 2, 28d supply, fill #1
  Filled 2023-12-02: qty 2, 28d supply, fill #2
  Filled 2024-01-05: qty 2, 28d supply, fill #3

## 2023-11-01 ENCOUNTER — Other Ambulatory Visit (HOSPITAL_COMMUNITY): Payer: Self-pay

## 2024-02-05 ENCOUNTER — Other Ambulatory Visit (HOSPITAL_COMMUNITY): Payer: Self-pay

## 2024-02-05 MED ORDER — MOUNJARO 12.5 MG/0.5ML ~~LOC~~ SOAJ
12.5000 mg | SUBCUTANEOUS | 3 refills | Status: DC
  Filled 2024-02-05: qty 2, 28d supply, fill #0
  Filled 2024-03-10: qty 2, 28d supply, fill #1
  Filled 2024-04-02: qty 2, 28d supply, fill #2
  Filled 2024-05-12: qty 2, 28d supply, fill #3
  Filled ????-??-??: fill #3

## 2024-02-21 ENCOUNTER — Other Ambulatory Visit: Payer: Self-pay | Admitting: Gastroenterology

## 2024-03-10 ENCOUNTER — Other Ambulatory Visit (HOSPITAL_COMMUNITY): Payer: Self-pay

## 2024-03-15 ENCOUNTER — Other Ambulatory Visit: Payer: Self-pay | Admitting: Neurology

## 2024-04-10 ENCOUNTER — Other Ambulatory Visit (HOSPITAL_COMMUNITY): Payer: Self-pay

## 2024-05-12 ENCOUNTER — Other Ambulatory Visit: Payer: Self-pay

## 2024-05-12 ENCOUNTER — Other Ambulatory Visit (HOSPITAL_COMMUNITY): Payer: Self-pay

## 2024-06-04 ENCOUNTER — Other Ambulatory Visit (HOSPITAL_COMMUNITY): Payer: Self-pay

## 2024-06-04 MED ORDER — MOUNJARO 12.5 MG/0.5ML ~~LOC~~ SOAJ
12.5000 mg | SUBCUTANEOUS | 3 refills | Status: AC
  Filled 2024-06-04: qty 2, 28d supply, fill #0
  Filled 2024-06-30: qty 2, 28d supply, fill #1

## 2024-07-15 ENCOUNTER — Other Ambulatory Visit: Payer: Self-pay

## 2024-07-15 ENCOUNTER — Other Ambulatory Visit (HOSPITAL_COMMUNITY): Payer: Self-pay

## 2024-07-15 MED ORDER — DEXCOM G7 SENSOR MISC
11 refills | Status: AC
  Filled 2024-07-15 – 2024-08-03 (×2): qty 2, 30d supply, fill #0

## 2024-07-15 MED ORDER — MOUNJARO 15 MG/0.5ML ~~LOC~~ SOAJ
15.0000 mg | SUBCUTANEOUS | 3 refills | Status: AC
  Filled 2024-07-15 – 2024-07-30 (×2): qty 2, 28d supply, fill #0

## 2024-07-28 ENCOUNTER — Other Ambulatory Visit (HOSPITAL_COMMUNITY): Payer: Self-pay

## 2024-07-30 ENCOUNTER — Other Ambulatory Visit (HOSPITAL_COMMUNITY): Payer: Self-pay

## 2024-08-04 ENCOUNTER — Other Ambulatory Visit (HOSPITAL_COMMUNITY): Payer: Self-pay
